# Patient Record
Sex: Male | Born: 1970 | Race: White | Hispanic: No | Marital: Single | Smoking: Current every day smoker
Health system: Southern US, Community
[De-identification: ages and names within clinical notes are randomized; demographics above are authoritative.]

## PROBLEM LIST (undated history)

## (undated) DIAGNOSIS — A4902 Methicillin resistant Staphylococcus aureus infection, unspecified site: Secondary | ICD-10-CM

---

## 1999-08-18 ENCOUNTER — Encounter: Payer: Self-pay | Admitting: Emergency Medicine

## 1999-08-18 ENCOUNTER — Emergency Department (HOSPITAL_COMMUNITY): Admission: EM | Admit: 1999-08-18 | Discharge: 1999-08-18 | Payer: Self-pay | Admitting: Emergency Medicine

## 2003-10-10 ENCOUNTER — Emergency Department (HOSPITAL_COMMUNITY): Admission: EM | Admit: 2003-10-10 | Discharge: 2003-10-10 | Payer: Self-pay | Admitting: Emergency Medicine

## 2003-12-12 ENCOUNTER — Emergency Department (HOSPITAL_COMMUNITY): Admission: EM | Admit: 2003-12-12 | Discharge: 2003-12-12 | Payer: Self-pay | Admitting: Emergency Medicine

## 2003-12-14 ENCOUNTER — Emergency Department (HOSPITAL_COMMUNITY): Admission: EM | Admit: 2003-12-14 | Discharge: 2003-12-14 | Payer: Self-pay | Admitting: Family Medicine

## 2004-04-19 ENCOUNTER — Emergency Department (HOSPITAL_COMMUNITY): Admission: EM | Admit: 2004-04-19 | Discharge: 2004-04-19 | Payer: Self-pay | Admitting: Emergency Medicine

## 2004-04-21 ENCOUNTER — Emergency Department (HOSPITAL_COMMUNITY): Admission: EM | Admit: 2004-04-21 | Discharge: 2004-04-21 | Payer: Self-pay | Admitting: Emergency Medicine

## 2006-12-14 ENCOUNTER — Emergency Department (HOSPITAL_COMMUNITY): Admission: EM | Admit: 2006-12-14 | Discharge: 2006-12-14 | Payer: Self-pay | Admitting: Emergency Medicine

## 2008-03-09 ENCOUNTER — Emergency Department (HOSPITAL_COMMUNITY): Admission: EM | Admit: 2008-03-09 | Discharge: 2008-03-09 | Payer: Self-pay | Admitting: Emergency Medicine

## 2009-07-25 ENCOUNTER — Emergency Department (HOSPITAL_COMMUNITY): Admission: EM | Admit: 2009-07-25 | Discharge: 2009-07-25 | Payer: Self-pay | Admitting: Emergency Medicine

## 2009-08-14 IMAGING — CR DG WRIST COMPLETE 3+V*R*
4 series · 4 of 4 positions shown · non-contrast
Comparison: No priors

CLINICAL DATA: Fell 2 weeks ago - pain and hand and wrist

RIGHT WRIST - COMPLETE 3+ VIEW

[x wrist pa right]
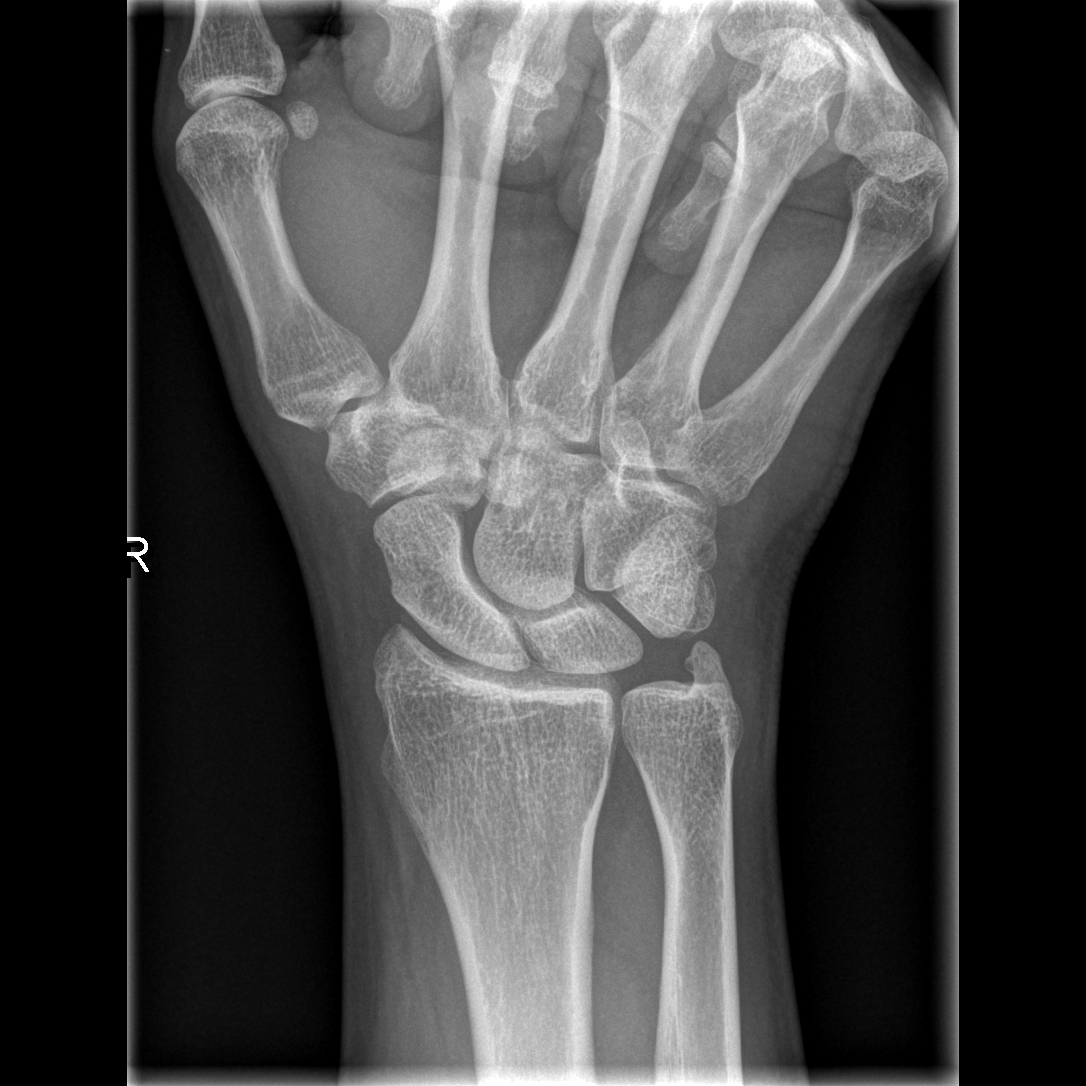

[x wrist obl right]
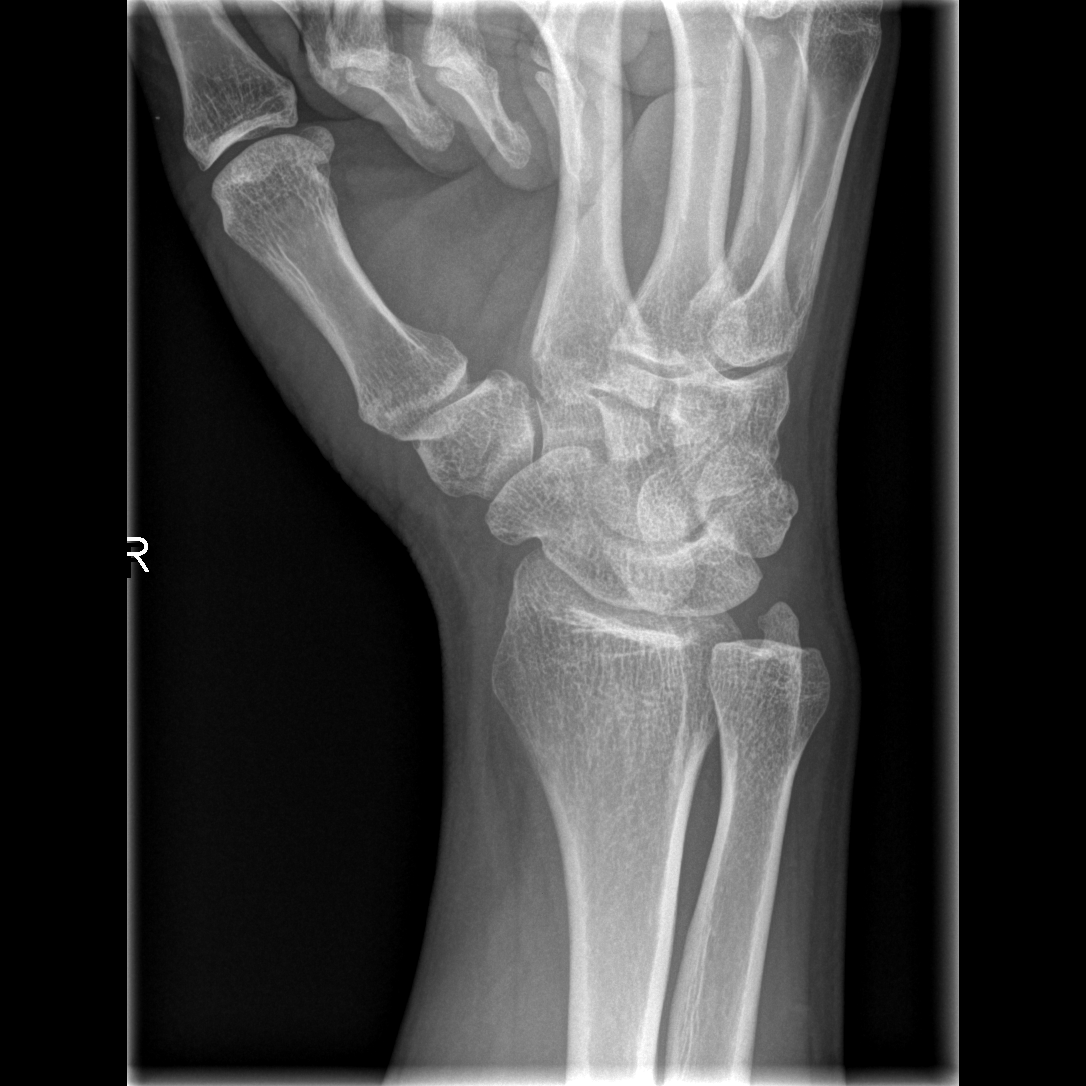

[x wrist lat right]
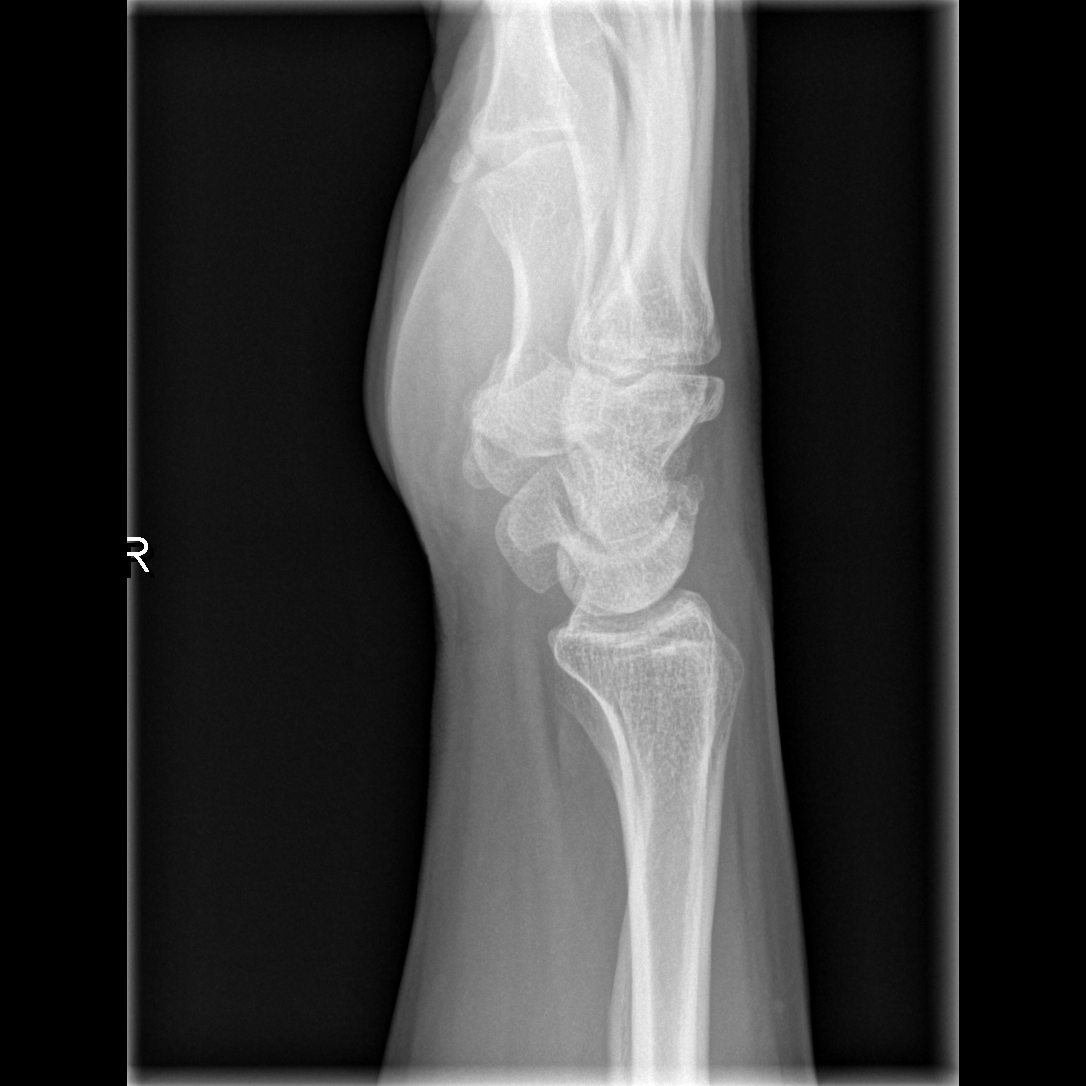

[x navicular]
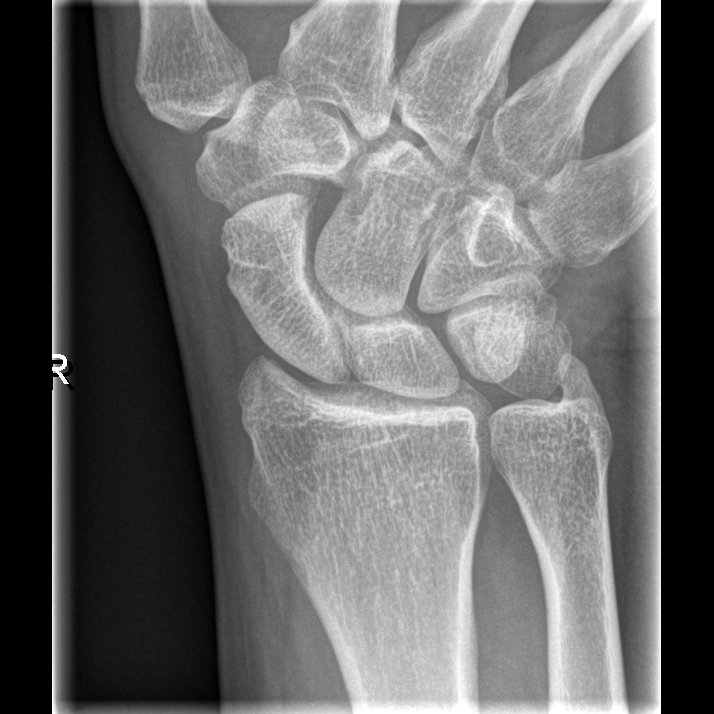

[4 of 4 positions shown; findings below may reference images not displayed]

FINDINGS: No definite fracture or dislocation.  There appears to be
a small accessory ossicle projecting over the dorsal aspect of the
wrist on the lateral view.  This could be due to remote trauma but
does not appear acute.
IMPRESSION: No acute findings.

## 2009-08-14 IMAGING — CR DG HAND COMPLETE 3+V*R*
3 series · 3 of 3 positions shown · non-contrast
Comparison: Report of a prior study 08/18/1999

CLINICAL DATA: Pain in right hand and wrist following fall 2 weeks
ago

RIGHT HAND - COMPLETE 3+ VIEW

[x hand pa right]
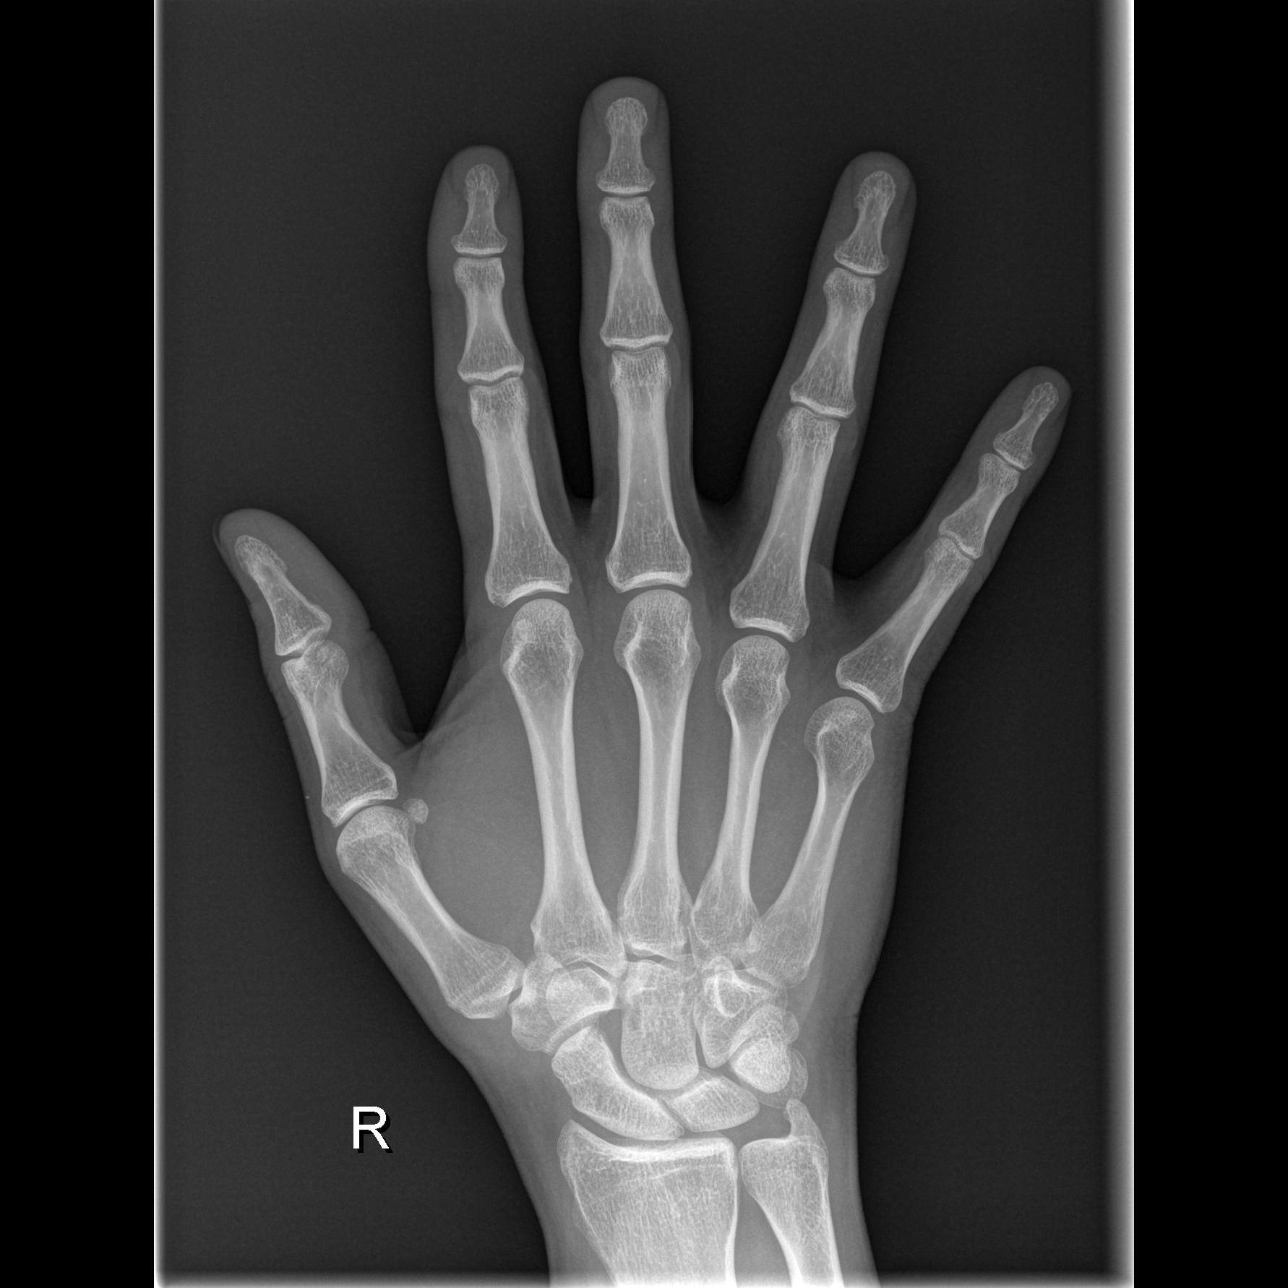

[x hand oblique right]
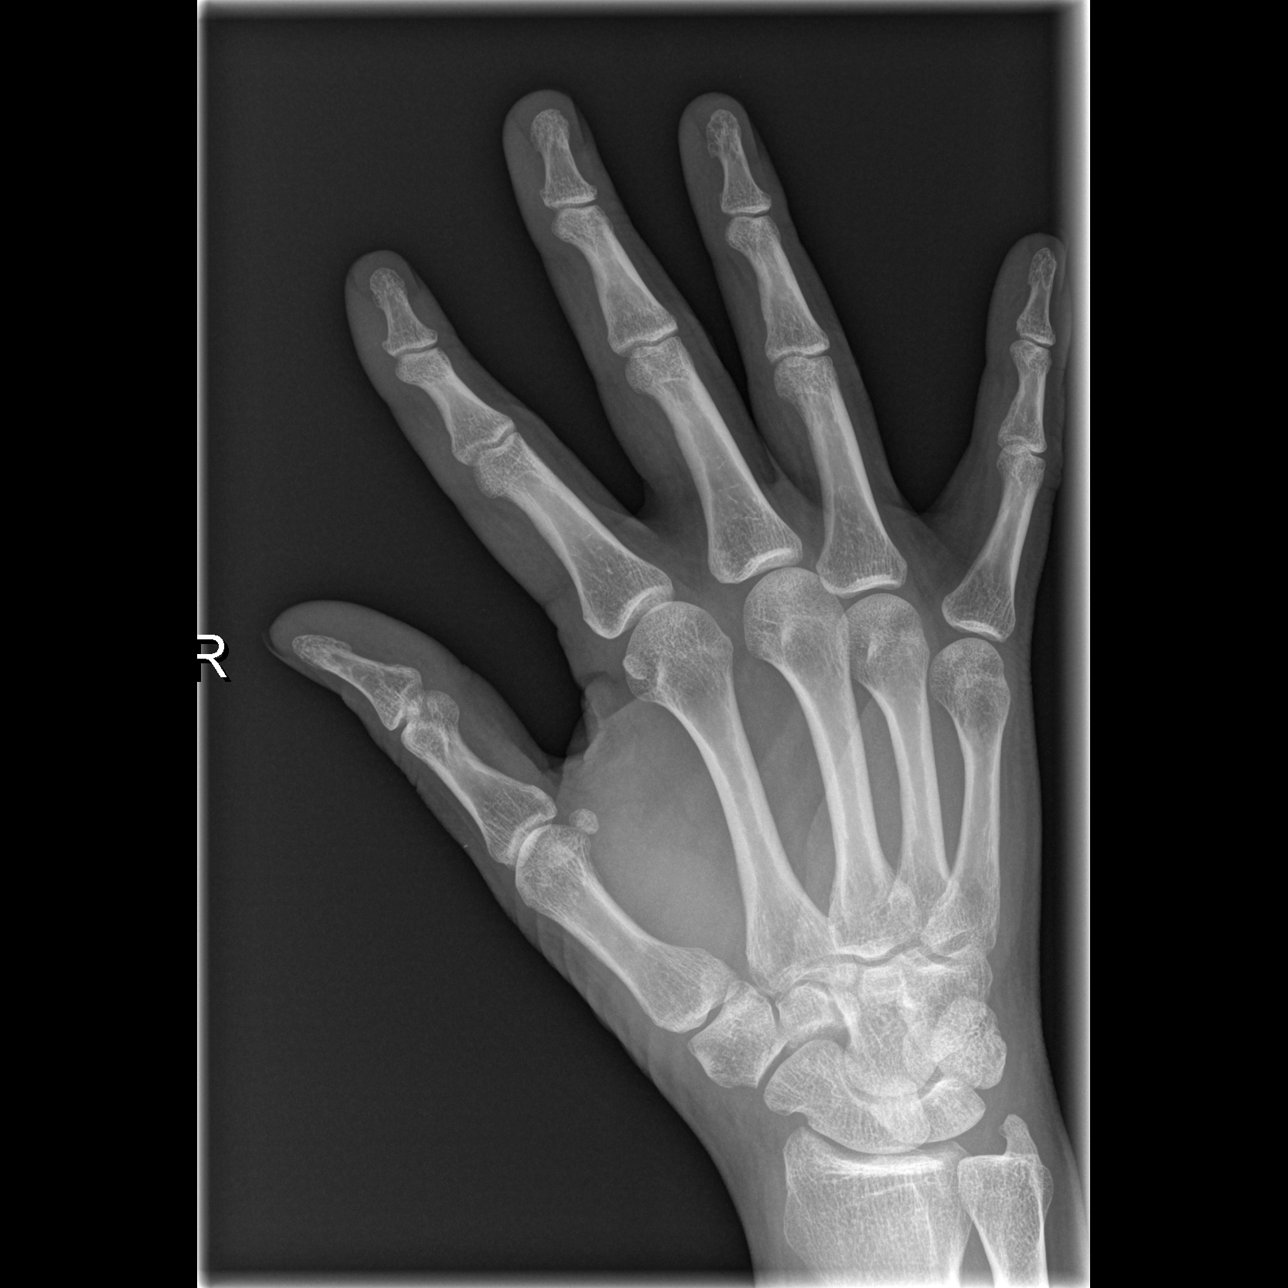

[x hand lat right]
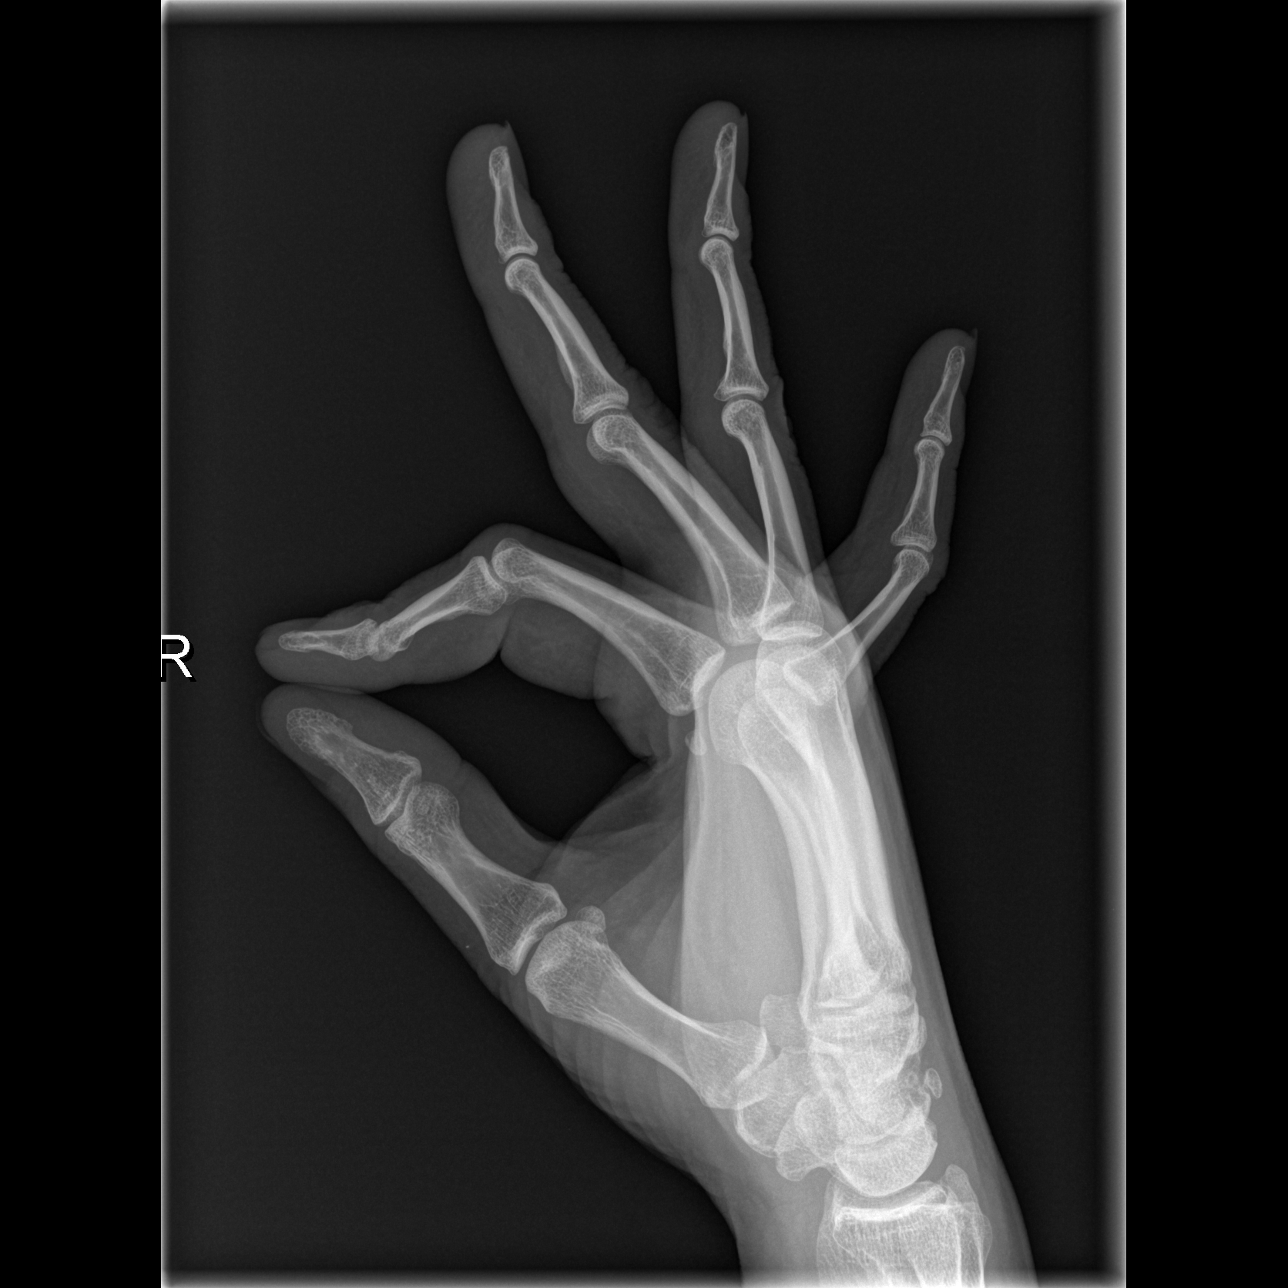

[3 of 3 positions shown; findings below may reference images not displayed]

FINDINGS: No definite fracture or dislocation.  Small accessory
ossicle noted over the dorsum of the carpal bones.
IMPRESSION: No acute findings.

## 2011-04-10 LAB — CULTURE, ROUTINE-ABSCESS

## 2011-08-24 ENCOUNTER — Emergency Department (HOSPITAL_COMMUNITY)
Admission: EM | Admit: 2011-08-24 | Discharge: 2011-08-24 | Disposition: A | Discharge status: Home / Self Care | Payer: Self-pay | Attending: Emergency Medicine | Admitting: Emergency Medicine

## 2011-08-24 ENCOUNTER — Encounter (HOSPITAL_COMMUNITY): Payer: Self-pay | Admitting: *Deleted

## 2011-08-24 DIAGNOSIS — L03019 Cellulitis of unspecified finger: Secondary | ICD-10-CM | POA: Insufficient documentation

## 2011-08-24 DIAGNOSIS — M79609 Pain in unspecified limb: Secondary | ICD-10-CM | POA: Insufficient documentation

## 2011-08-24 DIAGNOSIS — M7989 Other specified soft tissue disorders: Secondary | ICD-10-CM | POA: Insufficient documentation

## 2011-08-24 DIAGNOSIS — IMO0002 Reserved for concepts with insufficient information to code with codable children: Secondary | ICD-10-CM

## 2011-08-24 MED ORDER — ERYTHROMYCIN BASE 250 MG PO TABS: 250.0000 mg | ORAL_TABLET | Freq: Four times a day (QID) | ORAL | Status: AC

## 2011-08-24 MED ORDER — NAPROXEN 500 MG PO TABS: 500.0000 mg | ORAL_TABLET | Freq: Two times a day (BID) | ORAL | Status: AC

## 2011-08-24 NOTE — ED Notes (Signed)
Reports injury to right index finger cuticle yesterday, now has swelling.

## 2011-08-24 NOTE — ED Provider Notes (Signed)
History     CSN: [id number]  Arrival date & time 08/24/11  1136   First MD Initiated Contact with Patient 08/24/11 1148      Chief Complaint  Patient presents with  . Finger Injury    (Consider location/radiation/quality/duration/timing/severity/associated sxs/prior treatment) HPI Patient presents to the emergency room with complaints of swelling and redness of his right index finger cuticle. Patient states he for a piece of his cuticle off yesterday. Today he has noticed swelling redness and what appears to be pus underneath the skin. It is located him on the ulnar aspect of the cuticle of his right index finger. The pain is a 5/10. It is sharp. It increases with palpation and movement. He denies any fevers or any other complaints History reviewed. No pertinent past medical history.  History reviewed. No pertinent past surgical history.  History reviewed. No pertinent family history.  History  Substance Use Topics  . Smoking status: Current Everyday Smoker    Types: Cigarettes  . Smokeless tobacco: Not on file  . Alcohol Use: Yes     occ      Review of Systems  All other systems reviewed and are negative.    Allergies  Penicillins  Home Medications  No current outpatient prescriptions on file.  BP 125/69  Pulse 92  Temp(Src) 97.5 F (36.4 C) (Oral)  Resp 18  SpO2 97%  Physical Exam  Nursing note and vitals reviewed. Constitutional: He appears well-developed and well-nourished. No distress.  HENT:  Head: Normocephalic and atraumatic.  Right Ear: External ear normal.  Left Ear: External ear normal.  Eyes: Conjunctivae are normal. Right eye exhibits no discharge. Left eye exhibits no discharge. No scleral icterus.  Neck: Neck supple. No tracheal deviation present.  Cardiovascular: Normal rate.   Pulmonary/Chest: Effort normal. No stridor. No respiratory distress.  Musculoskeletal: He exhibits no edema.       Right hand: He exhibits tenderness and swelling.  He exhibits normal range of motion. normal sensation noted. Normal strength noted.       Hands: Neurological: He is alert. Cranial nerve deficit: no gross deficits.  Skin: Skin is warm and dry. No rash noted.  Psychiatric: He has a normal mood and affect.    ED Course  INCISION AND DRAINAGE Performed by: Linwood Dibbles R Authorized by: Linwood Dibbles R Consent: Verbal consent obtained. Written consent not obtained. Risks and benefits: risks, benefits and alternatives were discussed Consent given by: patient Type: abscess Body area: upper extremity Location details: right index finger Patient sedated: no Scalpel size: 11 Incision type: single straight Complexity: simple Drainage: purulent Drainage amount: moderate Patient tolerance: Patient tolerated the procedure well with no immediate complications.    Labs Reviewed - No data to display No results found.    MDM  Patient has a simple paronychia of his index finger. The wound was adequately incised and drained. He soaked his finger in warm water following the procedure. Patient has been instructed to continue warm soaks and he's been given medications for pain. I explained to him that typically antibiotics are not necessary. I will provide him a prescription for antibiotics but told him he should only begin taking them if he has does not noticed any improvement in the next couple of days.        Celene Kras, MD 08/24/11 [phone]

## 2013-09-30 ENCOUNTER — Emergency Department (HOSPITAL_COMMUNITY)
Admission: EM | Admit: 2013-09-30 | Discharge: 2013-09-30 | Disposition: A | Discharge status: Home / Self Care | Payer: Self-pay | Attending: Emergency Medicine | Admitting: Emergency Medicine

## 2013-09-30 ENCOUNTER — Encounter (HOSPITAL_COMMUNITY): Payer: Self-pay | Admitting: Emergency Medicine

## 2013-09-30 DIAGNOSIS — L739 Follicular disorder, unspecified: Secondary | ICD-10-CM

## 2013-09-30 DIAGNOSIS — F17200 Nicotine dependence, unspecified, uncomplicated: Secondary | ICD-10-CM | POA: Insufficient documentation

## 2013-09-30 DIAGNOSIS — Z880 Allergy status to penicillin: Secondary | ICD-10-CM | POA: Insufficient documentation

## 2013-09-30 DIAGNOSIS — R112 Nausea with vomiting, unspecified: Secondary | ICD-10-CM | POA: Insufficient documentation

## 2013-09-30 DIAGNOSIS — Z8614 Personal history of Methicillin resistant Staphylococcus aureus infection: Secondary | ICD-10-CM | POA: Insufficient documentation

## 2013-09-30 DIAGNOSIS — L738 Other specified follicular disorders: Secondary | ICD-10-CM | POA: Insufficient documentation

## 2013-09-30 DIAGNOSIS — L678 Other hair color and hair shaft abnormalities: Secondary | ICD-10-CM | POA: Insufficient documentation

## 2013-09-30 HISTORY — DX: Methicillin resistant Staphylococcus aureus infection, unspecified site: A49.02

## 2013-09-30 LAB — COMPREHENSIVE METABOLIC PANEL
ALBUMIN: 3.5 g/dL (ref 3.5–5.2)
ALT: 30 U/L (ref 0–53)
AST: 35 U/L (ref 0–37)
Alkaline Phosphatase: 97 U/L (ref 39–117)
BILIRUBIN TOTAL: 0.4 mg/dL (ref 0.3–1.2)
BUN: 14 mg/dL (ref 6–23)
CALCIUM: 9.4 mg/dL (ref 8.4–10.5)
CHLORIDE: 104 meq/L (ref 96–112)
CO2: 22 meq/L (ref 19–32)
CREATININE: 1.32 mg/dL (ref 0.50–1.35)
GFR calc Af Amer: 74 mL/min — ABNORMAL LOW (ref >90)
GFR calc non Af Amer: 64 mL/min — ABNORMAL LOW (ref >90)
Glucose, Bld: 108 mg/dL — ABNORMAL HIGH (ref 70–99)
Potassium: 4.8 mEq/L (ref 3.7–5.3)
SODIUM: 141 meq/L (ref 137–147)
Total Protein: 7 g/dL (ref 6.0–8.3)

## 2013-09-30 LAB — CBC WITH DIFFERENTIAL/PLATELET
BASOS ABS: 0 10*3/uL (ref 0.0–0.1)
BASOS PCT: 0 % (ref 0–1)
Eosinophils Absolute: 0.1 10*3/uL (ref 0.0–0.7)
Eosinophils Relative: 1 % (ref 0–5)
HEMATOCRIT: 40.9 % (ref 39.0–52.0)
Hemoglobin: 14.3 g/dL (ref 13.0–17.0)
LYMPHS PCT: 5 % — AB (ref 12–46)
Lymphs Abs: 0.7 10*3/uL (ref 0.7–4.0)
MCH: 31.2 pg (ref 26.0–34.0)
MCHC: 35 g/dL (ref 30.0–36.0)
MCV: 89.3 fL (ref 78.0–100.0)
MONO ABS: 1.6 10*3/uL — AB (ref 0.1–1.0)
Monocytes Relative: 11 % (ref 3–12)
NEUTROS ABS: 11.7 10*3/uL — AB (ref 1.7–7.7)
Neutrophils Relative %: 83 % — ABNORMAL HIGH (ref 43–77)
PLATELETS: 335 10*3/uL (ref 150–400)
RBC: 4.58 MIL/uL (ref 4.22–5.81)
RDW: 13.8 % (ref 11.5–15.5)
WBC: 14.1 10*3/uL — AB (ref 4.0–10.5)

## 2013-09-30 MED ORDER — FENTANYL CITRATE 0.05 MG/ML IJ SOLN
50.0000 ug | Freq: Once | INTRAMUSCULAR | Status: AC
  Administered 2013-09-30: 50 ug via INTRAVENOUS
  Filled 2013-09-30: qty 2

## 2013-09-30 MED ORDER — MUPIROCIN CALCIUM 2 % EX CREA: 1.0000 "application " | TOPICAL_CREAM | Freq: Three times a day (TID) | CUTANEOUS | Status: DC

## 2013-09-30 MED ORDER — ONDANSETRON HCL 4 MG/2ML IJ SOLN
4.0000 mg | Freq: Once | INTRAMUSCULAR | Status: AC
  Administered 2013-09-30: 4 mg via INTRAVENOUS
  Filled 2013-09-30: qty 2

## 2013-09-30 MED ORDER — ONDANSETRON 4 MG PO TBDP: 4.0000 mg | ORAL_TABLET | Freq: Three times a day (TID) | ORAL | Status: DC | PRN

## 2013-09-30 MED ORDER — SODIUM CHLORIDE 0.9 % IV BOLUS (SEPSIS)
1000.0000 mL | Freq: Once | INTRAVENOUS | Status: AC
  Administered 2013-09-30: 1000 mL via INTRAVENOUS

## 2013-09-30 MED ORDER — MORPHINE SULFATE 4 MG/ML IJ SOLN
4.0000 mg | Freq: Once | INTRAMUSCULAR | Status: DC
  Filled 2013-09-30: qty 1

## 2013-09-30 NOTE — ED Notes (Signed)
Pt with hx of MRSA to ED c/o pain and swelling to R upper lip near gum line.  In triage pt began to vomit and became hypotensive, though ao x 4 and non-diaphoretic.

## 2013-09-30 NOTE — ED Provider Notes (Signed)
CSN: [id number]     Arrival date & time 09/30/13  [id number] History   First MD Initiated Contact with Patient 09/30/13 [phone]     Chief Complaint  Patient presents with  . Abscess  . Hypotension     (Consider location/radiation/quality/duration/timing/severity/associated sxs/prior Treatment) HPI Comments: Patient is a 43 year old male past medical history significant for MRSA infection, tobacco use presented to the emergency department for 2 days of right-sided facial pain with associated swelling and erythema. Alleviating factors. Endorses area is tender to palpation. No other aggravating factors. Patient developed some nausea and 1-2 episodes of nonbloody nonbilious emesis while in the emergency department waiting room. States he still has some mild nausea but denies any abdominal pain. Patient denies any fevers, chills, purulent drainage from site, difficulty breathing or shortness of breath.   Patient is a 43 y.o. male presenting with abscess.  Abscess Associated symptoms: nausea and vomiting   Associated symptoms: no fever     Past Medical History  Diagnosis Date  . MRSA (methicillin resistant Staphylococcus aureus)    History reviewed. No pertinent past surgical history. No family history on file. History  Substance Use Topics  . Smoking status: Current Every Day Smoker -- 0.50 packs/day    Types: Cigarettes  . Smokeless tobacco: Not on file  . Alcohol Use: Yes     Comment: occ    Review of Systems  Constitutional: Negative for fever and chills.  HENT: Positive for facial swelling.   Respiratory: Negative for shortness of breath.   Cardiovascular: Negative for chest pain.  Gastrointestinal: Positive for nausea and vomiting. Negative for abdominal pain and diarrhea.  All other systems reviewed and are negative.     Allergies  Penicillins  Home Medications   Current Outpatient Rx  Name  Route  Sig  Dispense  Refill  . acetaminophen (TYLENOL) 325 MG tablet   Oral  Take 650 mg by mouth every 6 (six) hours as needed.         . diphenhydrAMINE (BENADRYL) 25 mg capsule   Oral   Take 50 mg by mouth every 6 (six) hours as needed for allergies.         . mupirocin cream (BACTROBAN) 2 %   Topical   Apply 1 application topically 3 (three) times daily. To affected area.   15 g   0   . ondansetron (ZOFRAN ODT) 4 MG disintegrating tablet   Oral   Take 1 tablet (4 mg total) by mouth every 8 (eight) hours as needed for nausea or vomiting.   10 tablet   0    BP 111/67  Pulse 93  Temp(Src) 99.1 F (37.3 C) (Oral)  Resp 16  SpO2 98% Physical Exam  Nursing note and vitals reviewed. Constitutional: He is oriented to person, place, and time. He appears well-developed and well-nourished. No distress.  HENT:  Head: Normocephalic and atraumatic.  Right Ear: External ear normal.  Left Ear: External ear normal.  Nose: Nose normal. No sinus tenderness, nasal deformity, septal deviation or nasal septal hematoma. No epistaxis.  No foreign bodies.    Mouth/Throat: Uvula is midline, oropharynx is clear and moist and mucous membranes are normal. No trismus in the jaw. No dental abscesses or uvula swelling. No oropharyngeal exudate or tonsillar abscesses.  Area marked is mildly erythematous with 1-2 small pustules noted, TTP, no induration or fluctuance.    Eyes: Conjunctivae are normal.  Neck: Normal range of motion. Neck supple.  Cardiovascular: Normal rate, regular rhythm,  normal heart sounds and intact distal pulses.   Pulmonary/Chest: Effort normal and breath sounds normal. No respiratory distress.  Abdominal: Soft. Bowel sounds are normal. He exhibits no distension. There is no tenderness. There is no rebound and no guarding.  Musculoskeletal: Normal range of motion.  Lymphadenopathy:    He has no cervical adenopathy.  Neurological: He is alert and oriented to person, place, and time.  Skin: Skin is warm and dry. He is not diaphoretic.  Psychiatric:  He has a normal mood and affect.    ED Course  Procedures (including critical care time) Medications  sodium chloride 0.9 % bolus 1,000 mL (0 mLs Intravenous Stopped 09/30/13 1121)  fentaNYL (SUBLIMAZE) injection 50 mcg (50 mcg Intravenous Given 09/30/13 1113)  ondansetron (ZOFRAN) injection 4 mg (4 mg Intravenous Given 09/30/13 1119)    Labs Review Labs Reviewed  CBC WITH DIFFERENTIAL - Abnormal; Notable for the following:    WBC 14.1 (*)    Neutrophils Relative % 83 (*)    Neutro Abs 11.7 (*)    Lymphocytes Relative 5 (*)    Monocytes Absolute 1.6 (*)    All other components within normal limits  COMPREHENSIVE METABOLIC PANEL - Abnormal; Notable for the following:    Glucose, Bld 108 (*)    GFR calc non Af Amer 64 (*)    GFR calc Af Amer 74 (*)    All other components within normal limits   Imaging Review No results found.   EKG Interpretation   Date/Time:  Thursday September 30 2013 08:32:38 EDT Ventricular Rate:  93 PR Interval:  158 QRS Duration: 76 QT Interval:  334 QTC Calculation: 415 R Axis:   96 Text Interpretation:  Normal sinus rhythm Rightward axis No previous  tracing Confirmed by Anitra Lauth  MD, WHITNEY ([id number]) on 09/30/2013 9:23:30 AM      MDM   Final diagnoses:  Nausea & vomiting  Folliculitis    Filed Vitals:   09/30/13 1247  BP: 111/67  Pulse: 93  Temp: 99.1 F (37.3 C)  Resp: 16   Afebrile, NAD, non-toxic appearing, AAOx4.   Patient initially noted to be hypotensive in triage, without chest pain, shortness of breath, diaphoresis, lightheadedness or dizziness. He only endorsed some nausea. On multiple repeat blood pressures patient did not drop below 100/50. He was given IV fluids with associated worsening of symptoms. He was not orthostatic symptomatically. Abdomen soft, non-tender, non-distended. A few episodes of non-bloody non-bilious emesis while in ED waiting room, no episodes while roomed in acute bed. Patient does not meet any Sepsis/SIRS  criteria. Mild leukocytosis noted, likely mildly elevated from acute emesis, labs otherwise normal. Patient noted to have folliculitis below right nostril. No evidence of gross abscess. Will prescribe bactroban. Patient able to ambulate without difficulty. He was discharged home with return precautions. Patient is agreeable to plan. Patient is stable at time of discharge     Jeannetta Ellis, PA-C 09/30/13 1432

## 2013-09-30 NOTE — Discharge Instructions (Signed)
Please follow up with your primary care physician in 1-2 days. If you do not have one please call the [facility] number listed above. Please take Zofran as prescribed. Please use Bactroban as prescribed. Please read all discharge instructions and return precautions.   Nausea and Vomiting Nausea is a sick feeling that often comes before throwing up (vomiting). Vomiting is a reflex where stomach contents come out of your mouth. Vomiting can cause severe loss of body fluids (dehydration). Children and elderly adults can become dehydrated quickly, especially if they also have diarrhea. Nausea and vomiting are symptoms of a condition or disease. It is important to find the cause of your symptoms. CAUSES   Direct irritation of the stomach lining. This irritation can result from increased acid production (gastroesophageal reflux disease), infection, food poisoning, taking certain medicines (such as nonsteroidal anti-inflammatory drugs), alcohol use, or tobacco use.  Signals from the brain.These signals could be caused by a headache, heat exposure, an inner ear disturbance, increased pressure in the brain from injury, infection, a tumor, or a concussion, pain, emotional stimulus, or metabolic problems.  An obstruction in the gastrointestinal tract (bowel obstruction).  Illnesses such as diabetes, hepatitis, gallbladder problems, appendicitis, kidney problems, cancer, sepsis, atypical symptoms of a heart attack, or eating disorders.  Medical treatments such as chemotherapy and radiation.  Receiving medicine that makes you sleep (general anesthetic) during surgery. DIAGNOSIS Your caregiver may ask for tests to be done if the problems do not improve after a few days. Tests may also be done if symptoms are severe or if the reason for the nausea and vomiting is not clear. Tests may include:  Urine tests.  Blood tests.  Stool tests.  Cultures (to look for evidence of  infection).  X-rays or other imaging studies. Test results can help your caregiver make decisions about treatment or the need for additional tests. TREATMENT You need to stay well hydrated. Drink frequently but in small amounts.You may wish to drink water, sports drinks, clear broth, or eat frozen ice pops or gelatin dessert to help stay hydrated.When you eat, eating slowly may help prevent nausea.There are also some antinausea medicines that may help prevent nausea. HOME CARE INSTRUCTIONS   Take all medicine as directed by your caregiver.  If you do not have an appetite, do not force yourself to eat. However, you must continue to drink fluids.  If you have an appetite, eat a normal diet unless your caregiver tells you differently.  Eat a variety of complex carbohydrates (rice, wheat, potatoes, bread), lean meats, yogurt, fruits, and vegetables.  Avoid high-fat foods because they are more difficult to digest.  Drink enough water and fluids to keep your urine clear or pale yellow.  If you are dehydrated, ask your caregiver for specific rehydration instructions. Signs of dehydration may include:  Severe thirst.  Dry lips and mouth.  Dizziness.  Dark urine.  Decreasing urine frequency and amount.  Confusion.  Rapid breathing or pulse. SEEK IMMEDIATE MEDICAL CARE IF:   You have blood or brown flecks (like coffee grounds) in your vomit.  You have black or bloody stools.  You have a severe headache or stiff neck.  You are confused.  You have severe abdominal pain.  You have chest pain or trouble breathing.  You do not urinate at least once every 8 hours.  You develop cold or clammy skin.  You continue to vomit for longer than 24 to 48 hours.  You have a fever.  MAKE SURE YOU:   Understand these instructions.  Will watch your condition.  Will get help right away if you are not doing well or get worse. Document Released: 06/10/2005 Document Revised: 09/02/2011  Document Reviewed: 11/07/2010 [facility] Patient Information 2014 [place], [place].  Folliculitis  Folliculitis is redness, soreness, and swelling (inflammation) of the hair follicles. This condition can occur anywhere on the body. People with weakened immune systems, diabetes, or obesity have a greater risk of getting folliculitis. CAUSES  Bacterial infection. This is the most common cause.  Fungal infection.  Viral infection.  Contact with certain chemicals, especially oils and tars. Long-term folliculitis can result from bacteria that live in the nostrils. The bacteria may trigger multiple outbreaks of folliculitis over time. SYMPTOMS Folliculitis most commonly occurs on the scalp, thighs, legs, back, buttocks, and areas where hair is shaved frequently. An early sign of folliculitis is a small, white or yellow, pus-filled, itchy lesion (pustule). These lesions appear on a red, inflamed follicle. They are usually less than 0.2 inches (5 mm) wide. When there is an infection of the follicle that goes deeper, it becomes a boil or furuncle. A group of closely packed boils creates a larger lesion (carbuncle). Carbuncles tend to occur in hairy, sweaty areas of the body. DIAGNOSIS  Your caregiver can usually tell what is wrong by doing a physical exam. A sample may be taken from one of the lesions and tested in a lab. This can help determine what is causing your folliculitis. TREATMENT  Treatment may include:  Applying warm compresses to the affected areas.  Taking antibiotic medicines orally or applying them to the skin.  Draining the lesions if they contain a large amount of pus or fluid.  Laser hair removal for cases of long-lasting folliculitis. This helps to prevent regrowth of the hair. HOME CARE INSTRUCTIONS  Apply warm compresses to the affected areas as directed by your caregiver.  If antibiotics are prescribed, take them as directed. Finish them even if you start to feel  better.  You may take over-the-counter medicines to relieve itching.  Do not shave irritated skin.  Follow up with your caregiver as directed. SEEK IMMEDIATE MEDICAL CARE IF:   You have increasing redness, swelling, or pain in the affected area.  You have a fever. MAKE SURE YOU:  Understand these instructions.  Will watch your condition.  Will get help right away if you are not doing well or get worse. Document Released: 08/19/2001 Document Revised: 12/10/2011 Document Reviewed: 09/10/2011 [facility] Patient Information 2014 [place], [place].

## 2013-09-30 NOTE — ED Notes (Addendum)
Per provider hold morphine until BP comes up.

## 2013-09-30 NOTE — ED Provider Notes (Signed)
Medical screening examination/treatment/procedure(s) were performed by non-physician practitioner and as supervising physician I was immediately available for consultation/collaboration.   EKG Interpretation   Date/Time:  Thursday September 30 2013 08:32:38 EDT Ventricular Rate:  93 PR Interval:  158 QRS Duration: 76 QT Interval:  334 QTC Calculation: 415 R Axis:   96 Text Interpretation:  Normal sinus rhythm Rightward axis No previous  tracing Confirmed by Anitra LauthPLUNKETT  MD, Rakin Lemelle ([id number]) on 09/30/2013 9:23:30 AM        Gwyneth SproutWhitney Dontel Harshberger, MD 09/30/13 [phone]

## 2015-04-21 ENCOUNTER — Emergency Department (HOSPITAL_COMMUNITY)
Admission: EM | Admit: 2015-04-21 | Discharge: 2015-04-21 | Disposition: A | Discharge status: Home / Self Care | Payer: Self-pay | Attending: Emergency Medicine | Admitting: Emergency Medicine

## 2015-04-21 ENCOUNTER — Emergency Department (HOSPITAL_COMMUNITY): Payer: Self-pay

## 2015-04-21 ENCOUNTER — Encounter (HOSPITAL_COMMUNITY): Payer: Self-pay | Admitting: Emergency Medicine

## 2015-04-21 DIAGNOSIS — Z720 Tobacco use: Secondary | ICD-10-CM | POA: Insufficient documentation

## 2015-04-21 DIAGNOSIS — X58XXXA Exposure to other specified factors, initial encounter: Secondary | ICD-10-CM | POA: Insufficient documentation

## 2015-04-21 DIAGNOSIS — Y92310 Basketball court as the place of occurrence of the external cause: Secondary | ICD-10-CM | POA: Insufficient documentation

## 2015-04-21 DIAGNOSIS — Y998 Other external cause status: Secondary | ICD-10-CM | POA: Insufficient documentation

## 2015-04-21 DIAGNOSIS — Z880 Allergy status to penicillin: Secondary | ICD-10-CM | POA: Insufficient documentation

## 2015-04-21 DIAGNOSIS — Y9367 Activity, basketball: Secondary | ICD-10-CM | POA: Insufficient documentation

## 2015-04-21 DIAGNOSIS — S93401A Sprain of unspecified ligament of right ankle, initial encounter: Secondary | ICD-10-CM | POA: Insufficient documentation

## 2015-04-21 DIAGNOSIS — Z8614 Personal history of Methicillin resistant Staphylococcus aureus infection: Secondary | ICD-10-CM | POA: Insufficient documentation

## 2015-04-21 DIAGNOSIS — Z792 Long term (current) use of antibiotics: Secondary | ICD-10-CM | POA: Insufficient documentation

## 2015-04-21 MED ORDER — IBUPROFEN 400 MG PO TABS
800.0000 mg | ORAL_TABLET | Freq: Once | ORAL | Status: AC
  Administered 2015-04-21: 800 mg via ORAL
  Filled 2015-04-21: qty 2

## 2015-04-21 MED ORDER — IBUPROFEN 800 MG PO TABS: 800.0000 mg | ORAL_TABLET | Freq: Three times a day (TID) | ORAL | Status: DC

## 2015-04-21 NOTE — ED Provider Notes (Signed)
CSN: [id number]     Arrival date & time 04/21/15  0920 History  By signing my name below, I, Elon Spanner, attest that this documentation has been prepared under the direction and in the presence of [facility], PA-C. Electronically Signed: Elon Spanner ED Scribe. 04/21/2015. 9:23 AM.    No chief complaint on file.  The history is provided by the patient. No language interpreter was used.   HPI Comments: Joseph Garza is a 44 y.o. male who presents to the Emergency Department complaining of constant, aching/throbbing right ankle pain and swelling onset last night after rolling his ankle while playing basketball; worse with walking; no treatments tried.  He denies other injuries, complaints.    Past Medical History  Diagnosis Date  . MRSA (methicillin resistant Staphylococcus aureus)    No past surgical history on file. No family history on file. Social History  Substance Use Topics  . Smoking status: Current Every Day Smoker -- 0.50 packs/day    Types: Cigarettes  . Smokeless tobacco: Not on file  . Alcohol Use: Yes     Comment: occ    Review of Systems  Musculoskeletal: Positive for joint swelling and arthralgias.  All other systems reviewed and are negative.     Allergies  Penicillins  Home Medications   Prior to Admission medications   Medication Sig Start Date End Date Taking? Authorizing Provider  acetaminophen (TYLENOL) 325 MG tablet Take 650 mg by mouth every 6 (six) hours as needed.    Historical Provider, MD  diphenhydrAMINE (BENADRYL) 25 mg capsule Take 50 mg by mouth every 6 (six) hours as needed for allergies.    Historical Provider, MD  mupirocin cream (BACTROBAN) 2 % Apply 1 application topically 3 (three) times daily. To affected area. 09/30/13   Jennifer Piepenbrink, PA-C  ondansetron (ZOFRAN ODT) 4 MG disintegrating tablet Take 1 tablet (4 mg total) by mouth every 8 (eight) hours as needed for nausea or vomiting. 09/30/13   Francee Piccolo, PA-C    There were no vitals taken for this visit. Physical Exam  Constitutional: He is oriented to person, place, and time. He appears well-developed and well-nourished. No distress.  HENT:  Head: Normocephalic and atraumatic.  Eyes: Conjunctivae and EOM are normal.  Neck: Normal range of motion. Neck supple. No tracheal deviation present.  Cardiovascular: Normal rate.   Pulmonary/Chest: Effort normal. No respiratory distress.  Abdominal: Soft. He exhibits no distension. There is no tenderness. There is no rebound.  Musculoskeletal:  Right ankle limited ROM due to pain. No obvious deformity. Generalized tenderness to palpation. Generalized swelling of right ankle.   Neurological: He is alert and oriented to person, place, and time. Coordination normal.  Skin: Skin is warm and dry.  Psychiatric: He has a normal mood and affect. His behavior is normal.  Nursing note and vitals reviewed.   ED Course  Procedures (including critical care time)   SPLINT APPLICATION Date/Time: 10:20 AM Authorized by: Emilia Beck Consent: Verbal consent obtained. Risks and benefits: risks, benefits and alternatives were discussed Consent given by: patient Splint applied by: orthopedic technician Location details: right ankle Splint type: aso brace Supplies used: aso brace Post-procedure: The splinted body part was neurovascularly unchanged following the procedure. Patient tolerance: Patient tolerated the procedure well with no immediate complications.     DIAGNOSTIC STUDIES: Oxygen Saturation is 97% on RA, normal by my interpretation.    COORDINATION OF CARE:  10:18 AM Will order ibuprofen and provide ankle brace.  Patient should  RICE and use ibuprofen/Tylenol.  Patient was offered but declined crutches.  Patient acknowledges and agrees with plan.    Labs Review Labs Reviewed - No data to display  Imaging Review Dg Ankle Complete Right  04/21/2015  CLINICAL DATA:  Basketball injury last  night as rolled right foot/ankle. EXAM: RIGHT ANKLE - COMPLETE 3+ VIEW COMPARISON:  None. FINDINGS: There is no evidence of fracture, dislocation, or joint effusion. There is no evidence of arthropathy or other focal bone abnormality. Soft tissues are unremarkable. IMPRESSION: Negative. Electronically Signed   By: Elberta Fortisaniel  Boyle M.D.   On: 04/21/2015 09:59   Dg Foot Complete Right  04/21/2015  CLINICAL DATA:  Acute right foot pain and swelling after injury playing basketball last night. Initial encounter. EXAM: RIGHT FOOT COMPLETE - 3+ VIEW COMPARISON:  None. FINDINGS: There is no evidence of fracture or dislocation. There is no evidence of arthropathy or other focal bone abnormality. Soft tissues are unremarkable. IMPRESSION: Normal right foot. Electronically Signed   By: Lupita RaiderJames  Green Jr, M.D.   On: 04/21/2015 10:01   I have personally reviewed and evaluated these images and lab results as part of my medical decision-making.   EKG Interpretation None      MDM   Final diagnoses:  Right ankle sprain, initial encounter    10:21 AM Patient's xray shows no acute changes. Patient has a right ankle sprain and will have ASO brace and ibuprofen for pain. No other injury.   I personally performed the services described in this documentation, which was scribed in my presence. The recorded information has been reviewed and is accurate.    Emilia BeckKaitlyn Edith Lord, PA-C 04/21/15 1025  Gilda Creasehristopher J Pollina, MD 04/21/15 1530

## 2015-04-21 NOTE — ED Notes (Signed)
Twisted right foot last pm while playing basketball. Swelling noted to dorsal right foot.

## 2015-04-21 NOTE — Discharge Instructions (Signed)
Take ibuprofen as needed for pain. Refer to attached documents for more information. Rest, ice, and elevate your ankle for pain relief.

## 2015-11-13 ENCOUNTER — Emergency Department (HOSPITAL_COMMUNITY)
Admission: EM | Admit: 2015-11-13 | Discharge: 2015-11-13 | Disposition: A | Discharge status: Home / Self Care | Payer: Self-pay | Attending: Emergency Medicine | Admitting: Emergency Medicine

## 2015-11-13 ENCOUNTER — Encounter (HOSPITAL_COMMUNITY): Payer: Self-pay | Admitting: Emergency Medicine

## 2015-11-13 ENCOUNTER — Emergency Department (HOSPITAL_COMMUNITY): Payer: Self-pay

## 2015-11-13 DIAGNOSIS — H9311 Tinnitus, right ear: Secondary | ICD-10-CM | POA: Insufficient documentation

## 2015-11-13 DIAGNOSIS — R55 Syncope and collapse: Secondary | ICD-10-CM

## 2015-11-13 DIAGNOSIS — Z79899 Other long term (current) drug therapy: Secondary | ICD-10-CM | POA: Insufficient documentation

## 2015-11-13 DIAGNOSIS — Z791 Long term (current) use of non-steroidal anti-inflammatories (NSAID): Secondary | ICD-10-CM | POA: Insufficient documentation

## 2015-11-13 DIAGNOSIS — F17210 Nicotine dependence, cigarettes, uncomplicated: Secondary | ICD-10-CM | POA: Insufficient documentation

## 2015-11-13 LAB — COMPREHENSIVE METABOLIC PANEL
ALBUMIN: 3.5 g/dL (ref 3.5–5.0)
ALK PHOS: 64 U/L (ref 38–126)
ALT: 15 U/L — ABNORMAL LOW (ref 17–63)
ANION GAP: 7 (ref 5–15)
AST: 19 U/L (ref 15–41)
BUN: 15 mg/dL (ref 6–20)
CALCIUM: 9 mg/dL (ref 8.9–10.3)
CO2: 20 mmol/L — ABNORMAL LOW (ref 22–32)
CREATININE: 1.55 mg/dL — AB (ref 0.61–1.24)
Chloride: 113 mmol/L — ABNORMAL HIGH (ref 101–111)
GFR calc Af Amer: >60 mL/min (ref >60)
GFR calc non Af Amer: 52 mL/min — ABNORMAL LOW (ref >60)
Glucose, Bld: 86 mg/dL (ref 65–99)
Potassium: 4.5 mmol/L (ref 3.5–5.1)
SODIUM: 140 mmol/L (ref 135–145)
TOTAL PROTEIN: 6.3 g/dL — AB (ref 6.5–8.1)
Total Bilirubin: 0.3 mg/dL (ref 0.3–1.2)

## 2015-11-13 LAB — CBC WITH DIFFERENTIAL/PLATELET
BASOS ABS: 0.1 10*3/uL (ref 0.0–0.1)
BASOS PCT: 0 %
EOS ABS: 0.1 10*3/uL (ref 0.0–0.7)
EOS PCT: 1 %
HCT: 44.5 % (ref 39.0–52.0)
HEMOGLOBIN: 15.5 g/dL (ref 13.0–17.0)
Lymphocytes Relative: 16 %
Lymphs Abs: 2.6 10*3/uL (ref 0.7–4.0)
MCH: 30.3 pg (ref 26.0–34.0)
MCHC: 34.8 g/dL (ref 30.0–36.0)
MCV: 86.9 fL (ref 78.0–100.0)
Monocytes Absolute: 2 10*3/uL — ABNORMAL HIGH (ref 0.1–1.0)
Monocytes Relative: 12 %
NEUTROS PCT: 71 %
Neutro Abs: 11.7 10*3/uL — ABNORMAL HIGH (ref 1.7–7.7)
PLATELETS: 380 10*3/uL (ref 150–400)
RBC: 5.12 MIL/uL (ref 4.22–5.81)
RDW: 14.5 % (ref 11.5–15.5)
WBC: 16.4 10*3/uL — AB (ref 4.0–10.5)

## 2015-11-13 LAB — I-STAT CG4 LACTIC ACID, ED
LACTIC ACID, VENOUS: 3.55 mmol/L — AB (ref 0.5–2.0)
Lactic Acid, Venous: 0.93 mmol/L (ref 0.5–2.0)

## 2015-11-13 LAB — TROPONIN I

## 2015-11-13 LAB — CBG MONITORING, ED: GLUCOSE-CAPILLARY: 88 mg/dL (ref 65–99)

## 2015-11-13 MED ORDER — SODIUM CHLORIDE 0.9 % IV BOLUS (SEPSIS)
1000.0000 mL | Freq: Once | INTRAVENOUS | Status: AC
  Administered 2015-11-13: 1000 mL via INTRAVENOUS

## 2015-11-13 NOTE — ED Notes (Signed)
Pt woke with a "stuffy ear" on the right  Friday am and had pain.  He began to have ringing in his ear and has pain 2/10

## 2015-11-13 NOTE — Discharge Instructions (Signed)
Syncope, commonly known as fainting, is a temporary loss of consciousness. It occurs when the blood flow to the brain is reduced. Vasovagal syncope (also called neurocardiogenic syncope) is a fainting spell in which the blood flow to the brain is reduced because of a sudden drop in heart rate and blood pressure. Vasovagal syncope occurs when the brain and the cardiovascular system (blood vessels) do not adequately communicate and respond to each other. This is the most common cause of fainting. It often occurs in response to fear or some other type of emotional or physical stress. The body has a reaction in which the heart starts beating too slowly or the blood vessels expand, reducing blood pressure. This type of fainting spell is generally considered harmless. However, injuries can occur if a person takes a sudden fall during a fainting spell.   CAUSES   Vasovagal syncope occurs when a person's blood pressure and heart rate decrease suddenly, usually in response to a trigger. Many things and situations can trigger an episode. Some of these include:    Pain.    Fear.    The sight of blood or medical procedures, such as blood being drawn from a vein.    Common activities, such as coughing, swallowing, stretching, or going to the bathroom.    Emotional stress.    Prolonged standing, especially in a warm environment.    Lack of sleep or rest.    Prolonged lack of food.    Prolonged lack of fluids.    Recent illness.   The use of certain drugs that affect blood pressure, such as cocaine, alcohol, marijuana, inhalants, and opiates.   SYMPTOMS   Before the fainting episode, you may:    Feel dizzy or light headed.    Become pale.   Sense that you are going to faint.    Feel like the room is spinning.    Have tunnel vision, only seeing directly in front of you.    Feel sick to your stomach (nauseous).    See spots or slowly lose vision.    Hear ringing in your ears.    Have a headache.     Feel warm and sweaty.    Feel a sensation of pins and needles.  During the fainting spell, you will generally be unconscious for no longer than a couple minutes before waking up and returning to normal. If you get up too quickly before your body can recover, you may faint again. Some twitching or jerky movements may occur during the fainting spell.   DIAGNOSIS   Your health care provider will ask about your symptoms, take a medical history, and perform a physical exam. Various tests may be done to rule out other causes of fainting. These may include blood tests and tests to check the heart, such as electrocardiography, echocardiography, and possibly an electrophysiology study. When other causes have been ruled out, a test may be done to check the body's response to changes in position (tilt table test).  TREATMENT   Most cases of vasovagal syncope do not require treatment. Your health care provider may recommend ways to avoid fainting triggers and may provide home strategies for preventing fainting. If you must be exposed to a possible trigger, you can drink additional fluids to help reduce your chances of having an episode of vasovagal syncope. If you have warning signs of an oncoming episode, you can respond by positioning yourself favorably (lying down).  If your fainting spells continue, you may be   given medicines to prevent fainting. Some medicines may help make you more resistant to repeated episodes of vasovagal syncope. Special exercises or compression stockings may be recommended. In rare cases, the surgical placement of a pacemaker is considered.  HOME CARE INSTRUCTIONS    Learn to identify the warning signs of vasovagal syncope.    Sit or lie down at the first warning sign of a fainting spell. If sitting, put your head down between your legs. If you lie down, swing your legs up in the air to increase blood flow to the brain.    Avoid hot tubs and saunas.   Avoid prolonged standing.   Drink  enough fluids to keep your urine clear or pale yellow. Avoid caffeine.   Increase salt in your diet as directed by your health care provider.    If you have to stand for a long time, perform movements such as:     Crossing your legs.     Flexing and stretching your leg muscles.     Squatting.     Moving your legs.     Bending over.    Only take over-the-counter or prescription medicines as directed by your health care provider. Do not suddenly stop any medicines without asking your health care provider first.  SEEK MEDICAL CARE IF:    Your fainting spells continue or happen more frequently in spite of treatment.    You lose consciousness for more than a couple minutes.   You have fainting spells during or after exercising or after being startled.    You have new symptoms that occur with the fainting spells, such as:     Shortness of breath.    Chest pain.     Irregular heartbeat.    You have episodes of twitching or jerky movements that last longer than a few seconds.   You have episodes of twitching or jerky movements without obvious fainting.  SEEK IMMEDIATE MEDICAL CARE IF:    You have injuries or bleeding after a fainting spell.    You have episodes of twitching or jerky movements that last longer than 5 minutes.    You have more than one spell of twitching or jerky movements before returning to consciousness after fainting.     This information is not intended to replace advice given to you by your health care provider. Make sure you discuss any questions you have with your health care provider.     Document Released: 05/27/2012 Document Revised: 10/25/2014 Document Reviewed: 05/27/2012  [employer]vier Interactive Patient Education 2016 [employer]

## 2015-11-13 NOTE — ED Notes (Signed)
Bed: WA17 Expected date:  Expected time:  Means of arrival:  Comments: Hold for fast track pt

## 2015-11-13 NOTE — ED Notes (Signed)
Pt ambulatory and independent at discharge.  

## 2015-11-13 NOTE — ED Notes (Signed)
Critical Istat result given to EDP Pickering and RN 

## 2015-11-13 NOTE — ED Provider Notes (Signed)
CSN: [id number]     Arrival date & time 11/13/15  1554 History   First MD Initiated Contact with Patient 11/13/15 1621     Chief Complaint  Patient presents with  . Tinnitus     The history is provided by the patient.  Patient presented to the ER for ringing in his right ear. While in fast track you began to feel lightheaded became diaphoretic and had a syncopal episode. Quickly regained consciousness and was not confused. No headache. No chest pain. Some nausea with the episode and vomited. Had a second episode and was taken to the main ER. No chest pain. No bowel pain. He's been doing well otherwise recently. No previous passing out.   Past Medical History  Diagnosis Date  . MRSA (methicillin resistant Staphylococcus aureus)    History reviewed. No pertinent past surgical history. History reviewed. No pertinent family history. Social History  Substance Use Topics  . Smoking status: Current Every Day Smoker -- 0.50 packs/day    Types: Cigarettes  . Smokeless tobacco: None  . Alcohol Use: Yes     Comment: occ    Review of Systems  Constitutional: Negative for fever, activity change and appetite change.  HENT: Positive for tinnitus.   Eyes: Negative for pain.  Respiratory: Negative for chest tightness and shortness of breath.   Cardiovascular: Negative for chest pain and leg swelling.  Gastrointestinal: Positive for vomiting. Negative for nausea, abdominal pain and diarrhea.  Genitourinary: Negative for flank pain.  Musculoskeletal: Negative for back pain and neck stiffness.  Skin: Negative for rash.  Neurological: Positive for syncope. Negative for weakness, numbness and headaches.  Psychiatric/Behavioral: Negative for behavioral problems.      Allergies  Penicillins  Home Medications   Prior to Admission medications   Medication Sig Start Date End Date Taking? Authorizing Provider  diphenhydrAMINE (BENADRYL) 25 mg capsule Take 50 mg by mouth every 6 (six) hours as  needed for allergies.   Yes Historical Provider, MD  ibuprofen (ADVIL,MOTRIN) 200 MG tablet Take 400 mg by mouth every 6 (six) hours as needed for headache or moderate pain.   Yes Historical Provider, MD   BP 97/68 mmHg  Pulse 60  Temp(Src) 98.7 F (37.1 C) (Oral)  Resp 18  Ht 6' (1.829 m)  Wt 183 lb (83.008 kg)  BMI 24.81 kg/m2  SpO2 100% Physical Exam  Constitutional: He appears well-developed.  HENT:  Head: Atraumatic.  Mild erythema on right TM. Left TM obscured by cerumen.  Neck: Neck supple.  Cardiovascular: Normal rate.   Pulmonary/Chest: Effort normal.  Abdominal: Soft. There is no tenderness.  Musculoskeletal: Normal range of motion.  Neurological: He is alert.  Skin: He is diaphoretic. There is pallor.    ED Course  Procedures (including critical care time) Labs Review Labs Reviewed  COMPREHENSIVE METABOLIC PANEL - Abnormal; Notable for the following:    Chloride 113 (*)    CO2 20 (*)    Creatinine, Ser 1.55 (*)    Total Protein 6.3 (*)    ALT 15 (*)    GFR calc non Af Amer 52 (*)    All other components within normal limits  CBC WITH DIFFERENTIAL/PLATELET - Abnormal; Notable for the following:    WBC 16.4 (*)    Neutro Abs 11.7 (*)    Monocytes Absolute 2.0 (*)    All other components within normal limits  I-STAT CG4 LACTIC ACID, ED - Abnormal; Notable for the following:    Lactic Acid, Venous  3.55 (*)    All other components within normal limits  TROPONIN I  CBG MONITORING, ED  I-STAT CG4 LACTIC ACID, ED    Imaging Review Dg Chest Portable 1 View  11/13/2015  CLINICAL DATA:  Syncope. EXAM: PORTABLE CHEST 1 VIEW COMPARISON:  None. FINDINGS: Heart size is normal. Overall cardiomediastinal silhouette is normal in size and configuration. Mild interstitial prominence bilaterally. Lungs otherwise clear. No confluent opacity to suggest consolidating pneumonia. No pleural effusion or pneumothorax seen. Osseous structures about the chest are unremarkable.  IMPRESSION: Mild interstitial prominence. Differential considerations include chronic interstitial lung disease and mild interstitial edema related to underlying infectious or inflammatory process. Bronchiolitis? Lungs otherwise clear. No evidence of consolidating pneumonia. No pleural effusion. Electronically Signed   By: Bary Richard M.D.   On: 11/13/2015 17:03   I have personally reviewed and evaluated these images and lab results as part of my medical decision-making.   EKG Interpretation   Date/Time:  Monday Nov 13 2015 16:38:48 EDT Ventricular Rate:  65 PR Interval:  161 QRS Duration: 97 QT Interval:  382 QTC Calculation: 397 R Axis:   53 Text Interpretation:  Sinus rhythm Low voltage, extremity and precordial  leads ST elev, probable normal early repol pattern Confirmed by Rubin Payor   MD, Harrold Donath [phone]) on 11/13/2015 5:19:09 PM      MDM   Final diagnoses:  Vasovagal syncope    Patient with likely vasovagal syncope event. Happened while being evaluated for his ringing in his ear. Likely unrelated. Initial lactic acid elevated but came down after IV fluids. Felt much better. Ear does not appear infected although there is some mild erythema. Will discharge home to follow-up as needed.    Benjiman Core, MD 11/13/15 [phone]

## 2016-03-21 ENCOUNTER — Emergency Department (HOSPITAL_COMMUNITY): Payer: Self-pay

## 2016-03-21 ENCOUNTER — Emergency Department (HOSPITAL_COMMUNITY)
Admission: EM | Admit: 2016-03-21 | Discharge: 2016-03-21 | Disposition: A | Discharge status: Home / Self Care | Payer: Self-pay | Attending: Emergency Medicine | Admitting: Emergency Medicine

## 2016-03-21 ENCOUNTER — Encounter (HOSPITAL_COMMUNITY): Payer: Self-pay | Admitting: *Deleted

## 2016-03-21 DIAGNOSIS — E860 Dehydration: Secondary | ICD-10-CM | POA: Insufficient documentation

## 2016-03-21 DIAGNOSIS — F17210 Nicotine dependence, cigarettes, uncomplicated: Secondary | ICD-10-CM | POA: Insufficient documentation

## 2016-03-21 DIAGNOSIS — R42 Dizziness and giddiness: Secondary | ICD-10-CM

## 2016-03-21 LAB — COMPREHENSIVE METABOLIC PANEL
ALT: 17 U/L (ref 17–63)
ANION GAP: 7 (ref 5–15)
AST: 16 U/L (ref 15–41)
Albumin: 3.7 g/dL (ref 3.5–5.0)
Alkaline Phosphatase: 67 U/L (ref 38–126)
BILIRUBIN TOTAL: 0.9 mg/dL (ref 0.3–1.2)
BUN: 16 mg/dL (ref 6–20)
CALCIUM: 9.2 mg/dL (ref 8.9–10.3)
CO2: 24 mmol/L (ref 22–32)
Chloride: 108 mmol/L (ref 101–111)
Creatinine, Ser: 1.13 mg/dL (ref 0.61–1.24)
GFR calc Af Amer: >60 mL/min (ref >60)
Glucose, Bld: 116 mg/dL — ABNORMAL HIGH (ref 65–99)
POTASSIUM: 4 mmol/L (ref 3.5–5.1)
Sodium: 139 mmol/L (ref 135–145)
TOTAL PROTEIN: 6.3 g/dL — AB (ref 6.5–8.1)

## 2016-03-21 LAB — CBC WITH DIFFERENTIAL/PLATELET
Basophils Absolute: 0 10*3/uL (ref 0.0–0.1)
Basophils Relative: 0 %
Eosinophils Absolute: 0.1 10*3/uL (ref 0.0–0.7)
Eosinophils Relative: 1 %
HEMATOCRIT: 39.3 % (ref 39.0–52.0)
Hemoglobin: 13.4 g/dL (ref 13.0–17.0)
LYMPHS ABS: 1.9 10*3/uL (ref 0.7–4.0)
LYMPHS PCT: 16 %
MCH: 30.5 pg (ref 26.0–34.0)
MCHC: 34.1 g/dL (ref 30.0–36.0)
MCV: 89.3 fL (ref 78.0–100.0)
MONO ABS: 1.4 10*3/uL — AB (ref 0.1–1.0)
MONOS PCT: 12 %
NEUTROS ABS: 8.3 10*3/uL — AB (ref 1.7–7.7)
Neutrophils Relative %: 71 %
Platelets: 374 10*3/uL (ref 150–400)
RBC: 4.4 MIL/uL (ref 4.22–5.81)
RDW: 14.3 % (ref 11.5–15.5)
WBC: 11.6 10*3/uL — ABNORMAL HIGH (ref 4.0–10.5)

## 2016-03-21 LAB — I-STAT TROPONIN, ED: Troponin i, poc: 0.01 ng/mL (ref 0.00–0.08)

## 2016-03-21 LAB — ETHANOL

## 2016-03-21 MED ORDER — SODIUM CHLORIDE 0.9 % IV BOLUS (SEPSIS)
1000.0000 mL | Freq: Once | INTRAVENOUS | Status: AC
  Administered 2016-03-21: 1000 mL via INTRAVENOUS

## 2016-03-21 NOTE — Discharge Instructions (Signed)
Stay hydrated.   Drink plenty of gatorade.   See your doctor   Stop drinking alcohol   Return to ER if you have worse dizziness, light headedness, passing out.

## 2016-03-21 NOTE — ED Provider Notes (Signed)
[facility]-EMERGENCY DEPT Provider Note   CSN: [id number] Arrival date & time: 03/21/16  0813     History   Chief Complaint Chief Complaint  Patient presents with  . Dizziness    HPI Joseph Garza is a 45 y.o. male hx of vasovagal syncope, Here is presenting with lightheadedness and dizziness. Patient states that he felt lightheaded and dizzy today. Patient states that when he stands up he felt lightheaded and dizzy like he is on pass out. Denies any chest pain or shortness of breath. Does drink water very frequently and denies any vomiting or fevers. Patient states that he came here several months ago for near syncope and was thought to have dehydration and given a bag of fluids and sent home. Patient does drink alcohol occasionally more on the weekends but denies chronic drinking issues.    The history is provided by the patient.    Past Medical History:  Diagnosis Date  . MRSA (methicillin resistant Staphylococcus aureus)     There are no active problems to display for this patient.   History reviewed. No pertinent surgical history.     Home Medications    Prior to Admission medications   Not on File    Family History No family history on file.  Social History Social History  Substance Use Topics  . Smoking status: Current Every Day Smoker    Packs/day: 0.50    Types: Cigarettes  . Smokeless tobacco: Never Used  . Alcohol use Yes     Comment: six pack a week     Allergies   Penicillins   Review of Systems Review of Systems  Neurological: Positive for dizziness.  All other systems reviewed and are negative.    Physical Exam Updated Vital Signs BP 111/73   Pulse 80   Temp 98 F (36.7 C) (Oral)   Resp 20   Ht 6' (1.829 m)   Wt 185 lb (83.9 kg)   SpO2 98%   BMI 25.09 kg/m   Physical Exam  Constitutional: He appears well-developed and well-nourished.  HENT:  Head: Normocephalic.  MM slightly dry   Eyes: EOM are normal. Pupils are equal,  round, and reactive to light.  Neck: Normal range of motion. Neck supple.  Cardiovascular: Normal rate, regular rhythm and normal heart sounds.   Pulmonary/Chest: Effort normal and breath sounds normal. No respiratory distress. He has no wheezes. He has no rales.  Abdominal: Soft. Bowel sounds are normal. He exhibits no distension. There is no tenderness. There is no guarding.  Musculoskeletal: Normal range of motion.  Neurological: He is alert.  Skin: Skin is warm.  Psychiatric: He has a normal mood and affect.  Nursing note and vitals reviewed.    ED Treatments / Results  Labs (all labs ordered are listed, but only abnormal results are displayed) Labs Reviewed  CBC WITH DIFFERENTIAL/PLATELET - Abnormal; Notable for the following:       Result Value   WBC 11.6 (*)    Neutro Abs 8.3 (*)    Monocytes Absolute 1.4 (*)    All other components within normal limits  COMPREHENSIVE METABOLIC PANEL - Abnormal; Notable for the following:    Glucose, Bld 116 (*)    Total Protein 6.3 (*)    All other components within normal limits  ETHANOL  Rosezena SensorI-STAT TROPOININ, ED    EKG  EKG Interpretation  Date/Time:  Thursday March 21 2016 08:58:53 EDT Ventricular Rate:  63 PR Interval:    QRS Duration:  95 QT Interval:  392 QTC Calculation: 402 R Axis:   51 Text Interpretation:  Sinus rhythm No significant change since last tracing Confirmed by YAO  MD, DAVID ([id number]) on 03/21/2016 9:38:13 AM       Radiology Ct Head Wo Contrast  Result Date: 03/21/2016 CLINICAL DATA:  Dizziness and lightheadedness since this morning. Some leg weakness. EXAM: CT HEAD WITHOUT CONTRAST TECHNIQUE: Contiguous axial images were obtained from the base of the skull through the vertex without intravenous contrast. COMPARISON:  None. FINDINGS: Brain: No evidence of acute infarction, hemorrhage, hydrocephalus, extra-axial collection or mass lesion/mass effect. Developmental appearing asymmetry of the lateral ventricles.  Vascular: No hyperdense vessel or unexpected calcification. Skull: Normal. Negative for fracture or focal lesion. Sinuses/Orbits: No acute finding. IMPRESSION: Negative head CT. Electronically Signed   By: Marnee Spring M.D.   On: 03/21/2016 10:18    Procedures Procedures (including critical care time)  Medications Ordered in ED Medications  sodium chloride 0.9 % bolus 1,000 mL (1,000 mLs Intravenous New Bag/Given 03/21/16 0914)     Initial Impression / Assessment and Plan / ED Course  I have reviewed the triage vital signs and the nursing notes.  Pertinent labs & imaging results that were available during my care of the patient were reviewed by me and considered in my medical decision making (see chart for details).  Clinical Course    Joseph Garza is a 45 y.o. male here with dizziness, light headedness. Likely mild dehydration. Has chronic dizziness and drinks alcohol so will get CT head but has nl neuro exam. Will get labs, CT head, ETOH.   11:07 AM Labs and CT head unremarkable. Not orthostatic. Given IVF and felt better. Recommend stop drinking alcohol, try gatorade at home    Final Clinical Impressions(s) / ED Diagnoses   Final diagnoses:  None    New Prescriptions New Prescriptions   No medications on file     Charlynne Pander, MD 03/21/16 [phone]

## 2016-03-21 NOTE — ED Triage Notes (Addendum)
Patient states this morning upon waking he felt okay, but when he stood up and started moving around, he felt light headed.  Patient states he drinks about 42 ounces of water each day and has been eating and drinking per baseline.  Patient denies pain and SOB.  Patient denies N/V/D, fever and a dry cough.  Patient states he had an episode similar to this in May and was seen here in the ED for hydration.

## 2016-09-25 IMAGING — CR DG ANKLE COMPLETE 3+V*R*
3 series · 3 of 3 positions shown · non-contrast
Comparison: None.

CLINICAL DATA: Basketball injury last night as rolled right
foot/ankle.

EXAM:
RIGHT ANKLE - COMPLETE 3+ VIEW

[ankle ap]
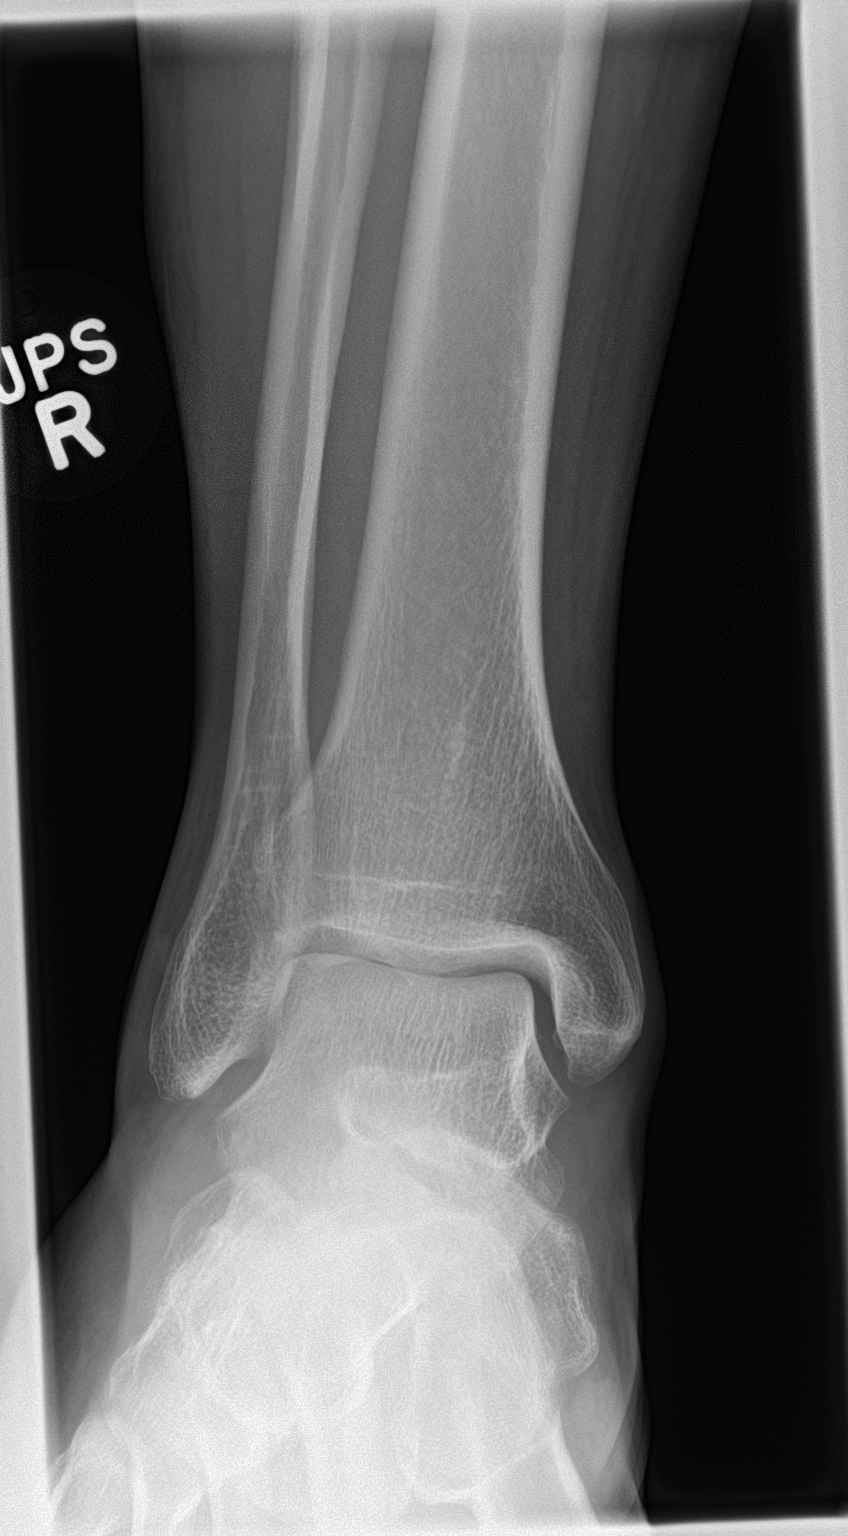

[ankle obl]
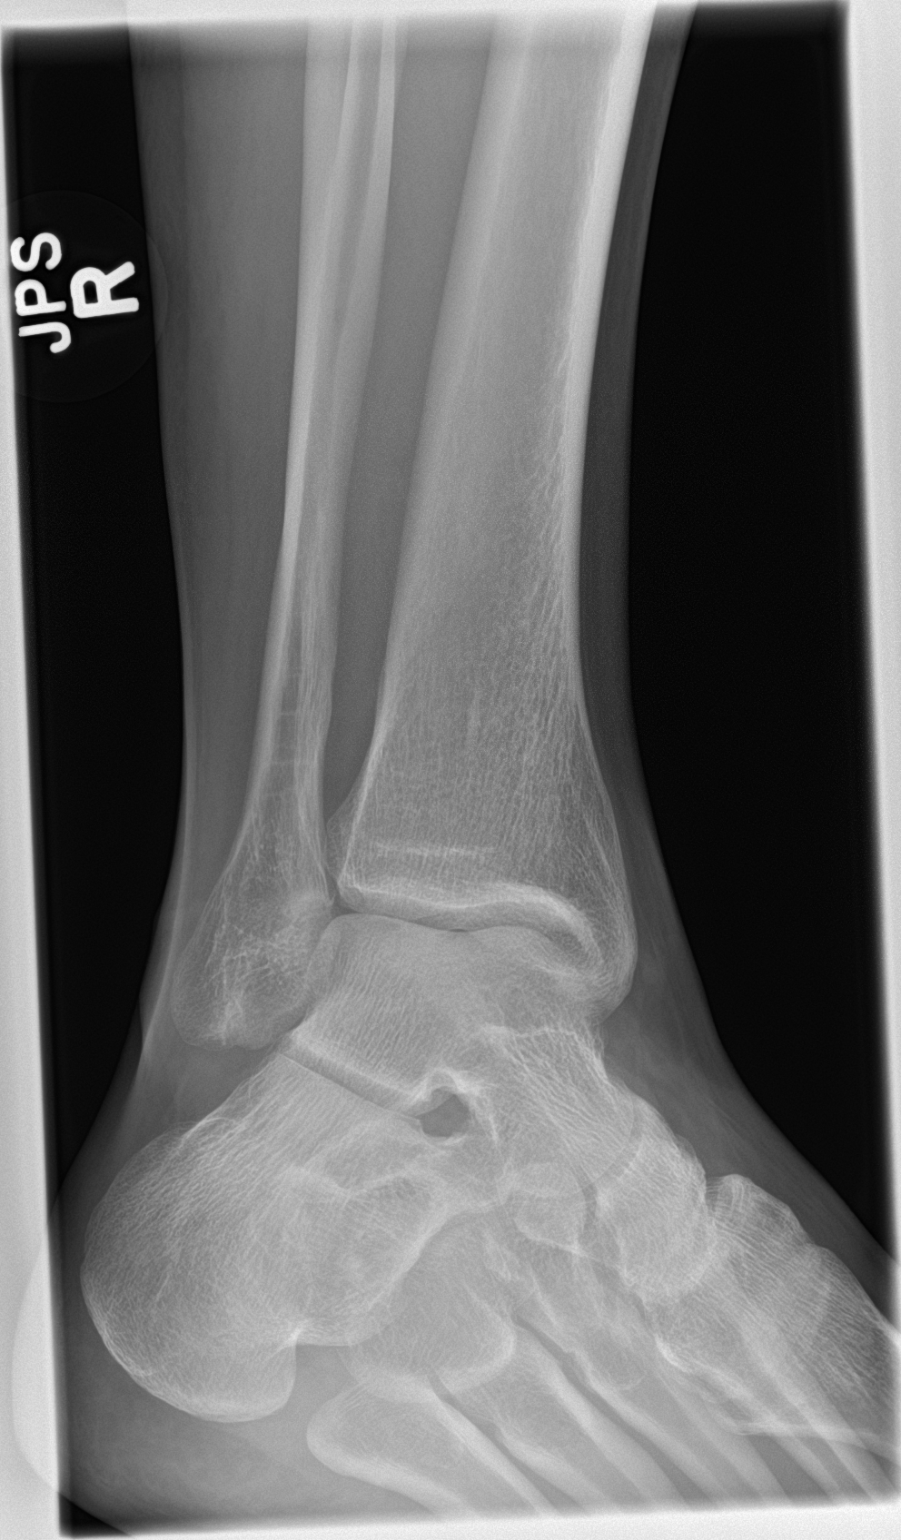

[ankle lat]
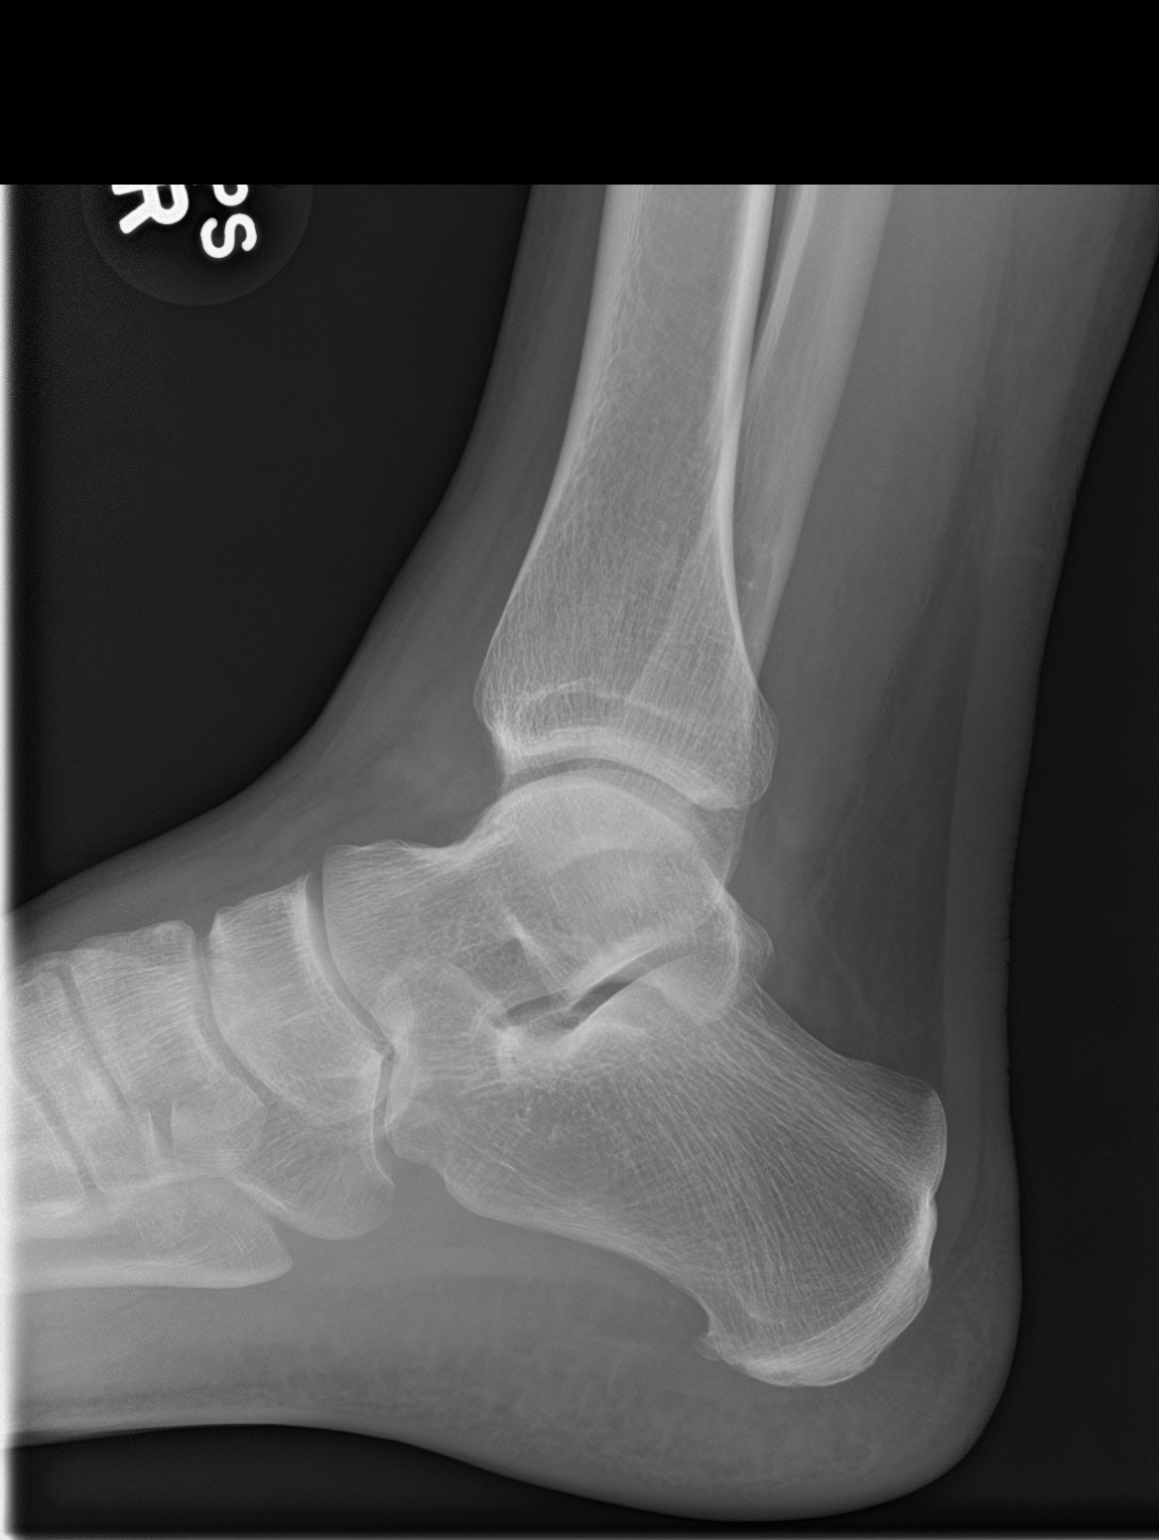

[3 of 3 positions shown; findings below may reference images not displayed]

FINDINGS: There is no evidence of fracture, dislocation, or joint effusion.
There is no evidence of arthropathy or other focal bone abnormality.
Soft tissues are unremarkable.
IMPRESSION: Negative.

## 2017-06-08 ENCOUNTER — Encounter (HOSPITAL_COMMUNITY): Payer: Self-pay | Admitting: Emergency Medicine

## 2017-06-08 ENCOUNTER — Emergency Department (HOSPITAL_COMMUNITY)
Admission: EM | Admit: 2017-06-08 | Discharge: 2017-06-08 | Disposition: A | Discharge status: Home / Self Care | Payer: Self-pay | Attending: Emergency Medicine | Admitting: Emergency Medicine

## 2017-06-08 DIAGNOSIS — F17210 Nicotine dependence, cigarettes, uncomplicated: Secondary | ICD-10-CM | POA: Insufficient documentation

## 2017-06-08 DIAGNOSIS — R7989 Other specified abnormal findings of blood chemistry: Secondary | ICD-10-CM | POA: Insufficient documentation

## 2017-06-08 DIAGNOSIS — I951 Orthostatic hypotension: Secondary | ICD-10-CM | POA: Insufficient documentation

## 2017-06-08 LAB — CBG MONITORING, ED: Glucose-Capillary: 107 mg/dL — ABNORMAL HIGH (ref 65–99)

## 2017-06-08 LAB — HEPATIC FUNCTION PANEL
ALT: 21 U/L (ref 17–63)
AST: 18 U/L (ref 15–41)
Albumin: 4.2 g/dL (ref 3.5–5.0)
Alkaline Phosphatase: 86 U/L (ref 38–126)
Bilirubin, Direct: <0.1 mg/dL — ABNORMAL LOW (ref 0.1–0.5)
TOTAL PROTEIN: 7.6 g/dL (ref 6.5–8.1)
Total Bilirubin: 0.7 mg/dL (ref 0.3–1.2)

## 2017-06-08 LAB — CBC
HEMATOCRIT: 41.3 % (ref 39.0–52.0)
Hemoglobin: 14 g/dL (ref 13.0–17.0)
MCH: 31.2 pg (ref 26.0–34.0)
MCHC: 33.9 g/dL (ref 30.0–36.0)
MCV: 92 fL (ref 78.0–100.0)
Platelets: 372 10*3/uL (ref 150–400)
RBC: 4.49 MIL/uL (ref 4.22–5.81)
RDW: 14.1 % (ref 11.5–15.5)
WBC: 12.1 10*3/uL — ABNORMAL HIGH (ref 4.0–10.5)

## 2017-06-08 LAB — BASIC METABOLIC PANEL
ANION GAP: 6 (ref 5–15)
BUN: 10 mg/dL (ref 6–20)
CHLORIDE: 106 mmol/L (ref 101–111)
CO2: 25 mmol/L (ref 22–32)
Calcium: 10 mg/dL (ref 8.9–10.3)
Creatinine, Ser: 1.31 mg/dL — ABNORMAL HIGH (ref 0.61–1.24)
Glucose, Bld: 110 mg/dL — ABNORMAL HIGH (ref 65–99)
POTASSIUM: 5 mmol/L (ref 3.5–5.1)
SODIUM: 137 mmol/L (ref 135–145)

## 2017-06-08 LAB — I-STAT TROPONIN, ED: TROPONIN I, POC: 0 ng/mL (ref 0.00–0.08)

## 2017-06-08 MED ORDER — SODIUM CHLORIDE 0.9 % IV BOLUS (SEPSIS): 1000.0000 mL | Freq: Once | INTRAVENOUS | Status: DC

## 2017-06-08 MED ORDER — SODIUM CHLORIDE 0.9 % IV BOLUS (SEPSIS)
1000.0000 mL | Freq: Once | INTRAVENOUS | Status: AC
  Administered 2017-06-08: 1000 mL via INTRAVENOUS

## 2017-06-08 NOTE — ED Provider Notes (Signed)
[facility] Provider Note   CSN: [id number] Arrival date & time: 06/08/17  1323     History   Chief Complaint Chief Complaint  Patient presents with  . Hypertension  . Near Syncope    HPI Joseph Garza is a 46 y.o. male who presents to the emergency department with a chief complaint of diaphoresis with associated lightheadedness that began this morning after eating breakfast.  The patient reports that he was feeling at his baseline yesterday, and this morning said that he was feeling "off"so he went to his neighbor's house to check his blood pressure and received a reading of 148/95.  On repeat check is 144/95.  The patient reports his baseline blood pressure is 120/80.  He reports that after he checked his blood pressure for the second time that he began to feel as if he was working harder to breathe that would last for approximately 3 minutes before resolving spontaneously; however he denies dyspnea.   No aggravating or alleviating factors.  He reports that his lightheadedness continued so he came to the emergency department for evaluation.  Nursing staff reports a vasovagal episode while the patient was having his blood drawn.  He has no complaints at this time.  He states that when he felt like this before that he was dehydrated and had to come into the ED for fluids.  He reports PO intake consistent with sodas, tea, and fast food. He reports he travels a lot for his new job, and has not been hydrating as well as he previously did at his old job. He denies fever, chills, nausea, vomiting, diarrhea, abdominal pain, chest pain, dizziness, or headache.  No chronic past medical history.  No daily medications.  The history is provided by the patient. No language interpreter was used.    Past Medical History:  Diagnosis Date  . MRSA (methicillin resistant Staphylococcus aureus)     There are no active problems to display for this patient.   No  past surgical history on file.     Home Medications    Prior to Admission medications   Not on File    Family History No family history on file.  Social History Social History   Tobacco Use  . Smoking status: Current Every Day Smoker    Packs/day: 0.50    Types: Cigarettes  . Smokeless tobacco: Never Used  Substance Use Topics  . Alcohol use: Yes    Comment: six pack a week  . Drug use: No     Allergies   Penicillins   Review of Systems Review of Systems  Constitutional: Positive for diaphoresis. Negative for activity change, chills and fever.  Eyes: Negative for visual disturbance.  Respiratory: Negative for shortness of breath.   Cardiovascular: Negative for chest pain.  Gastrointestinal: Negative for abdominal pain, diarrhea, nausea and vomiting.  Genitourinary: Negative for dysuria.  Musculoskeletal: Negative for back pain.  Skin: Negative for rash.  Allergic/Immunologic: Negative for immunocompromised state.  Neurological: Positive for syncope and light-headedness. Negative for dizziness, weakness and headaches.  Psychiatric/Behavioral: Negative for confusion.     Physical Exam Updated Vital Signs BP (!) 126/97   Pulse 70   Temp 98 F (36.7 C)   Resp 16   Ht 6' (1.829 m)   Wt 79.4 kg (175 lb)   SpO2 96%   BMI 23.73 kg/m   Physical Exam  Constitutional: He appears well-developed.  Appears mildly jaundiced.  HENT:  Head: Normocephalic.  Nose: Nose normal. Right sinus exhibits no maxillary sinus tenderness and no frontal sinus tenderness. Left sinus exhibits no maxillary sinus tenderness and no frontal sinus tenderness.  Mouth/Throat: Uvula is midline and oropharynx is clear and moist.  Cerumen noted in the bilateral canals.  Eyes: Conjunctivae are normal. No scleral icterus.  Neck: Normal range of motion. Neck supple. No tracheal deviation present. No thyromegaly present.  Cardiovascular: Normal rate, regular rhythm, normal heart sounds and  intact distal pulses. Exam reveals no gallop and no friction rub.  No murmur heard. Pulmonary/Chest: Effort normal and breath sounds normal. No stridor. No respiratory distress. He has no wheezes. He has no rales. He exhibits no tenderness.  Abdominal: Soft. Bowel sounds are normal. He exhibits no distension and no mass. There is no tenderness. There is no rebound and no guarding. No hernia.  Musculoskeletal: Normal range of motion. He exhibits no edema, tenderness or deformity.  Neurological: He is alert.  Cranial nerves 2-12 intact. Finger-to-nose is normal. 5/5 motor strength of the bilateral upper and lower extremities. Moves all four extremities. Negative Romberg.  No pronator drift.  Symmetric tandem gait.  NVI.    Skin: Skin is warm and dry.  Psychiatric: His behavior is normal.  Nursing note and vitals reviewed.    ED Treatments / Results  Labs (all labs ordered are listed, but only abnormal results are displayed) Labs Reviewed  BASIC METABOLIC PANEL - Abnormal; Notable for the following components:      Result Value   Glucose, Bld 110 (*)    Creatinine, Ser 1.31 (*)    All other components within normal limits  CBC - Abnormal; Notable for the following components:   WBC 12.1 (*)    All other components within normal limits  HEPATIC FUNCTION PANEL - Abnormal; Notable for the following components:   Bilirubin, Direct <0.1 (*)    All other components within normal limits  CBG MONITORING, ED - Abnormal; Notable for the following components:   Glucose-Capillary 107 (*)    All other components within normal limits  URINALYSIS, ROUTINE W REFLEX MICROSCOPIC  I-STAT TROPONIN, ED    EKG  EKG Interpretation None       Radiology No results found.  Procedures Procedures (including critical care time)  Medications Ordered in ED Medications  sodium chloride 0.9 % bolus 1,000 mL (0 mLs Intravenous Stopped 06/08/17 1721)     Initial Impression / Assessment and Plan / ED  Course  I have reviewed the triage vital signs and the nursing notes.  Pertinent labs & imaging results that were available during my care of the patient were reviewed by me and considered in my medical decision making (see chart for details).    46 year old male presenting with lightheadedness and diaphoresis.  The patient had a vasovagal episode in triage.  Positive orthostatics of 103/68 lying, 94/63/ sitting and 73/51 standing.  Creatinine elevated from his baseline of 1.0 to 1.31.  IV fluid bolus given.  Repeat orthostatics are negative.  Discussed with the patient adding another IVF bolus and rechecking his blood pressure, which he decline because "he is hungry and wants cook out."  He was successfully p.o. challenged in the emergency department.  He is asymptomatic with ambulation.  LFTS are unremarkable. Labs are otherwise reassuring.  [facility] for creatinine recheck in the next week.  Strict return precautions given.  No acute distress.  The patient is safe for discharge at this time.  Final Clinical Impressions(s) / ED Diagnoses   Final diagnoses:  Elevated serum creatinine  Orthostatic hypotension    ED Discharge Orders    None       Barkley Boards, PA-C 06/08/17 1750    Rolan Bucco, MD 06/09/17 1322

## 2017-06-08 NOTE — ED Notes (Signed)
Pt became pale and diaphoretic after blood draw. Pt color is back to his baseline (slightly jaundiced) and states he feels better.

## 2017-06-08 NOTE — Discharge Instructions (Signed)
Your creatinine levels are elevated today, which I suspect is the cause of your symptoms. Staying hydrated with fluids is important to make sure you don't get dehydrated or cause your creatinine to get elevated.  Please call [place] [facility] to schedule follow-up appointment in the next week to have your creatinine levels rechecked.  If you develop new or worsening symptoms, including, shortness of breath, chest pain, lightheadedness, or other concerning symptoms, please return to the emergency department for reevaluation.

## 2017-06-08 NOTE — ED Triage Notes (Addendum)
Pt states he has felt weak, with no chest pain no abdominal pain, no neurological symptoms. States he noted his blood pressure was high (140/90) at home. States "I think I am dehydrated, one time I came here and got fluids for it and felt better." Pt states no hx of kidney disease and states he has been eating a drinking per usual. Denies urinary symptoms. VAN negative.

## 2017-06-08 NOTE — ED Notes (Signed)
Urine collection cup at bedside.  Pt aware urine is needed

## 2018-05-04 ENCOUNTER — Other Ambulatory Visit: Payer: Self-pay

## 2018-05-04 ENCOUNTER — Encounter (HOSPITAL_COMMUNITY): Payer: Self-pay

## 2018-05-04 ENCOUNTER — Emergency Department (HOSPITAL_COMMUNITY)
Admission: EM | Admit: 2018-05-04 | Discharge: 2018-05-04 | Disposition: A | Discharge status: Home / Self Care | Payer: Self-pay | Attending: Emergency Medicine | Admitting: Emergency Medicine

## 2018-05-04 DIAGNOSIS — H66003 Acute suppurative otitis media without spontaneous rupture of ear drum, bilateral: Secondary | ICD-10-CM | POA: Insufficient documentation

## 2018-05-04 DIAGNOSIS — F17210 Nicotine dependence, cigarettes, uncomplicated: Secondary | ICD-10-CM | POA: Insufficient documentation

## 2018-05-04 MED ORDER — AMOXICILLIN-POT CLAVULANATE 875-125 MG PO TABS
1.0000 | ORAL_TABLET | Freq: Two times a day (BID) | ORAL | 0 refills | Status: AC
Start: 2018-05-04 — End: 2018-05-14

## 2018-05-04 NOTE — ED Notes (Signed)
ED Provider at bedside. 

## 2018-05-04 NOTE — ED Triage Notes (Signed)
Pt from home with PTAR c.o ringing in his ears for 1 week, pain in left ear. Denies drainage. Pt reports intermittent dizziness. Taking sudafed at home without relief, none taken today. Pt a.o, nad noted.

## 2018-05-04 NOTE — ED Provider Notes (Signed)
Medical screening examination/treatment/procedure(s) were conducted as a shared visit with non-physician practitioner(s) and myself.  I personally evaluated the patient during the encounter.  Clinical Impression:   Final diagnoses:  Acute suppurative otitis media of both ears without spontaneous rupture of tympanic membranes, recurrence not specified    Pt has 1 week of progressive hearing loss and ear pain after having URI - is not febrile - takes no daily meds / no allergies, stopped smoking 2 days ago   On exam has bilateral OM purulent / suppurative bulging TM's - no signs of perforation - no OE.  No ttp over mastoid and no swelling or erythema of same, no torticollis or trismus.  10 days of augmentin Pt in agreement Stable for d/c No immunocompromise.   Eber Hong, MD 05/05/18 [phone]

## 2018-05-04 NOTE — ED Notes (Signed)
Patient verbalizes understanding of discharge instructions. Opportunity for questioning and answers were provided. Armband removed by staff, pt discharged from ED.  

## 2018-05-04 NOTE — ED Provider Notes (Signed)
[facility] EMERGENCY DEPARTMENT Provider Note   CSN: [id number] Arrival date & time: 05/04/18  1143     History   Chief Complaint Chief Complaint  Patient presents with  . Tinnitus  . Dizziness    HPI Joseph Garza is a 47 y.o. male presenting with constant bilateral tinnitus and hearing loss onset 1 week ago. Patient also reports bilateral intermittent non radiating ear pain. Patient states ear pain is worse on the left than the right. Patient reports he has had cold symptoms including rhinorrhea, sore throat, subjective fevers, cough, light-headedness, and headache. Patient has tried Sudafed without relief. Patient denies, ear drainage, swimming, or ear trauma.  HPI  Past Medical History:  Diagnosis Date  . MRSA (methicillin resistant Staphylococcus aureus)     There are no active problems to display for this patient.   History reviewed. No pertinent surgical history.      Home Medications    Prior to Admission medications   Medication Sig Start Date End Date Taking? Authorizing Provider  amoxicillin-clavulanate (AUGMENTIN) 875-125 MG tablet Take 1 tablet by mouth 2 (two) times daily for 10 days. 05/04/18 05/14/18  Leretha Dykes, PA-C    Family History No family history on file.  Social History Social History   Tobacco Use  . Smoking status: Current Every Day Smoker    Packs/day: 0.50    Types: Cigarettes  . Smokeless tobacco: Never Used  Substance Use Topics  . Alcohol use: Yes    Comment: six pack a week  . Drug use: No     Allergies   Penicillins   Review of Systems Review of Systems  Constitutional: Positive for fever (Pt reports subjective fevers.). Negative for activity change, appetite change and fatigue.  HENT: Positive for congestion, ear pain (Pt reports bilateral ear pain.), rhinorrhea, sore throat and tinnitus. Negative for ear discharge and postnasal drip.   Eyes: Negative for redness.  Respiratory: Negative  for cough and shortness of breath.   Cardiovascular: Negative for chest pain.  Gastrointestinal: Negative for abdominal pain, diarrhea, nausea and vomiting.  Musculoskeletal: Negative for myalgias.  Skin: Negative for rash.  Allergic/Immunologic: Negative for environmental allergies and immunocompromised state.  Neurological: Positive for light-headedness. Negative for dizziness, weakness and headaches.     Physical Exam Updated Vital Signs BP 107/72 (BP Location: Right Arm)   Pulse 96   Temp 98.6 F (37 C) (Oral)   Resp 17   Ht 6' (1.829 m)   Wt 81.6 kg   SpO2 97%   BMI 24.41 kg/m   Physical Exam  Constitutional: He appears well-developed and well-nourished. No distress.  HENT:  Head: Normocephalic and atraumatic.  Right Ear: No drainage or tenderness. No mastoid tenderness. A middle ear effusion is present. Decreased hearing is noted.  Left Ear: No drainage or tenderness. There is mastoid tenderness. A middle ear effusion is present. Decreased hearing is noted.  Nose: Mucosal edema and rhinorrhea present.  Mouth/Throat: Uvula is midline. Posterior oropharyngeal erythema present. No oropharyngeal exudate or posterior oropharyngeal edema.  Erythema noted around canals bilaterally. Bulging and purulent material noted behind the TMs bilaterally.   Eyes: Conjunctivae are normal. Right eye exhibits no discharge. Left eye exhibits no discharge.  Neck: Normal range of motion. Neck supple.  Cardiovascular: Normal rate, regular rhythm and normal heart sounds. Exam reveals no gallop and no friction rub.  No murmur heard. Pulmonary/Chest: Effort normal and breath sounds normal. No respiratory distress. He has no wheezes. He  has no rales.  Abdominal: Soft. There is no tenderness.  Musculoskeletal: Normal range of motion.  Lymphadenopathy:    He has no cervical adenopathy.  Neurological: He is alert.  Skin: No rash noted. He is not diaphoretic.  Psychiatric: He has a normal mood and  affect.  Nursing note and vitals reviewed.    ED Treatments / Results  Labs (all labs ordered are listed, but only abnormal results are displayed) Labs Reviewed - No data to display  EKG None  Radiology No results found.  Procedures Procedures (including critical care time)  Medications Ordered in ED Medications - No data to display   Initial Impression / Assessment and Plan / ED Course  I have reviewed the triage vital signs and the nursing notes.  Pertinent labs & imaging results that were available during my care of the patient were reviewed by me and considered in my medical decision making (see chart for details).    Patient presents with otalgia and exam consistent with acute otitis media. Patient discharged home with Augmentin due to mastoid tenderness. Patient denies allergy to penicillins. I have also discussed reasons to return immediately to the ER.  Parent expresses understanding and agrees with plan.   Final Clinical Impressions(s) / ED Diagnoses   Final diagnoses:  Acute suppurative otitis media of both ears without spontaneous rupture of tympanic membranes, recurrence not specified    ED Discharge Orders         Ordered    amoxicillin-clavulanate (AUGMENTIN) 875-125 MG tablet  2 times daily     05/04/18 1244           Carlyle Basques [place], [place] 05/04/18 1247    Eber Hong, MD 05/05/18 [phone]

## 2018-05-04 NOTE — Discharge Instructions (Addendum)
You have been seen today for ear pain, ringing, and hearing loss. Please read and follow all provided instructions.   1. Medications: Augmentin (antibiotic), usual home medications 2. Treatment: rest, drink plenty of fluids. 3. Follow Up: Please follow up with your primary doctor in 2 days for discussion of your diagnoses and further evaluation after today's visit; if you do not have a primary care doctor use the resource guide provided to find one; Please return to the ER for any new or worsening symptoms.   Take medications as prescribed. Return to the emergency room for worsening condition or new concerning symptoms. Follow up with your regular doctor. If you don't have a regular doctor use one of the numbers below to establish a primary care doctor.   Emergency Department Resource Guide 1) Find a Doctor and Pay Out of Pocket Although you won't have to find out who is covered by your insurance plan, it is a good idea to ask around and get recommendations. You will then need to call the office and see if the doctor you have chosen will accept you as a new patient and what types of options they offer for patients who are self-pay. Some doctors offer discounts or will set up payment plans for their patients who do not have insurance, but you will need to ask so you aren't surprised when you get to your appointment.  2) Contact Your Local Health Department Not all health departments have doctors that can see patients for sick visits, but many do, so it is worth a call to see if yours does. If you don't know where your local health department is, you can check in your phone book. The CDC also has a tool to help you locate your state's health department, and many state websites also have listings of all of their local health departments.  3) Find a Walk-in Clinic If your illness is not likely to be very severe or complicated, you may want to try a walk in clinic. These are popping up all over the  country in pharmacies, drugstores, and shopping centers. They're usually staffed by nurse practitioners or physician assistants that have been trained to treat common illnesses and complaints. They're usually fairly quick and inexpensive. However, if you have serious medical issues or chronic medical problems, these are probably not your best option.  No Primary Care Doctor: Call Health Connect at  [phone] - they can help you locate a primary care doctor that  accepts your insurance, provides certain services, etc. Physician Referral [phone]  Emergency Department Resource Guide 1) Find a Doctor and Pay Out of Pocket Although you won't have to find out who is covered by your insurance plan, it is a good idea to ask around and get recommendations. You will then need to call the office and see if the doctor you have chosen will accept you as a new patient and what types of options they offer for patients who are self-pay. Some doctors offer discounts or will set up payment plans for their patients who do not have insurance, but you will need to ask so you aren't surprised when you get to your appointment.  2) Contact Your Local Health Department Not all health departments have doctors that can see patients for sick visits, but many do, so it is worth a call to see if yours does. If you don't know where your local health department is, you can check in your phone book. The CDC also has  a tool to help you locate your state's health department, and many state websites also have listings of all of their local health departments.  3) Find a Walk-in Clinic If your illness is not likely to be very severe or complicated, you may want to try a walk in clinic. These are popping up all over the country in pharmacies, drugstores, and shopping centers. They're usually staffed by nurse practitioners or physician assistants that have been trained to treat common illnesses and complaints. They're usually  fairly quick and inexpensive. However, if you have serious medical issues or chronic medical problems, these are probably not your best option.  No Primary Care Doctor: Call Health Connect at  [phone] - they can help you locate a primary care doctor that  accepts your insurance, provides certain services, etc. Physician Referral Service- [phone]  Chronic Pain Problems: Organization         Address  Phone   Notes  [facility]  [phone] Patients need to be referred by their primary care doctor.   Medication Assistance: Organization         Address  Phone   Notes  [facility] Medication [facility] [address] [place] [place], [place] [address] [phone] --Must be a resident of [facility] -- Must have NO insurance coverage whatsoever (no Medicaid/ Medicare, etc.) -- The pt. MUST have a primary care doctor that directs their care regularly and follows them in the community   [facility]  [phone]   [employer]  [phone]    Agencies that provide inexpensive medical care: Organization         Address  Phone   Notes  [facility]  [phone]   [facility]    [phone]   [facility] [address] [place], [place] [address] [phone]   [facility] [address] [place], [place] [phone]   [facility]anned [facility]    [phone]   [facility]    [phone]   [facility]  [address], [place] Phone:  [phone], Fax:  [phone] Hours of Operation:  9 am - 6 pm, M-F.  Also accepts Medicaid/Medicare and self-pay.  [facility]  [address], [place] Phone: [phone], Fax: [phone]. Hours of Operation:  8:30 am - 5:30 pm, M-F.  Also accepts Medicaid and self-pay.  [facility] [place] [address], [place] Phone: [phone]   [facility] [address] Natasha Bence [place], [place] [phone], Ext. [phone] Mondays & Thursdays: 7-9 AM.  First 15 patients are seen on a first come, first serve basis.    Medicaid-accepting [facility] Providers:  Organization         Address  Phone   Notes  [facility] [address], [place] [phone] Also accepts self-pay patients.  [facility] [address] Laurell Josephs [place], [place]  [phone]   [facility] [address], [place] [phone]   [facility] [address] [place] [phone]   Renaye Rakers [address], [place]   [phone] Only accepts [place] patients after they have their name applied to their card.   Self-Pay (no insurance) in [place]:  Organization         Address  Phone   Notes  Sickle Cell Patients, [facility] [place] [phone]   [facility] [place] [phone]   [facility] [place]  [place], [address],  [phone]   [facility]/Dr. Osei-Bonsu  [address] [place] [address], [place] [phone] Phone number for both [place] and [place] locations is the same.  [facility] [address] [place] [phone]   [facility] [address], [place] or [address] [phone]   [facility] [address] [place] [phone], phone; [phone], fax Sees patients 1st and 3rd Saturday of every month.  Must not qualify for public or private insurance (i.e. Medicaid, Medicare, [place] Health Choice, Veterans' Benefits)  Household income should be no more than 200% of the poverty level The clinic cannot treat you if  you are pregnant or think you are pregnant  Sexually transmitted diseases are not treated at the clinic.

## 2018-05-07 ENCOUNTER — Inpatient Hospital Stay: Payer: Self-pay

## 2019-10-20 ENCOUNTER — Ambulatory Visit: Payer: Self-pay | Attending: Internal Medicine

## 2019-10-20 ENCOUNTER — Other Ambulatory Visit: Payer: Self-pay

## 2019-10-20 DIAGNOSIS — Z20822 Contact with and (suspected) exposure to covid-19: Secondary | ICD-10-CM | POA: Insufficient documentation

## 2019-10-21 LAB — NOVEL CORONAVIRUS, NAA: SARS-CoV-2, NAA: NOT DETECTED

## 2019-10-21 LAB — SARS-COV-2, NAA 2 DAY TAT

## 2021-07-30 ENCOUNTER — Ambulatory Visit (HOSPITAL_COMMUNITY): Payer: No Payment, Other | Admitting: Licensed Clinical Social Worker

## 2022-03-13 DIAGNOSIS — M25571 Pain in right ankle and joints of right foot: Secondary | ICD-10-CM | POA: Diagnosis not present

## 2022-03-13 DIAGNOSIS — Z13220 Encounter for screening for lipoid disorders: Secondary | ICD-10-CM | POA: Diagnosis not present

## 2022-03-13 DIAGNOSIS — Z0000 Encounter for general adult medical examination without abnormal findings: Secondary | ICD-10-CM | POA: Diagnosis not present

## 2022-03-13 DIAGNOSIS — G4700 Insomnia, unspecified: Secondary | ICD-10-CM | POA: Diagnosis not present

## 2022-03-13 DIAGNOSIS — F418 Other specified anxiety disorders: Secondary | ICD-10-CM | POA: Diagnosis not present

## 2022-03-13 DIAGNOSIS — Z0001 Encounter for general adult medical examination with abnormal findings: Secondary | ICD-10-CM | POA: Diagnosis not present

## 2022-04-12 DIAGNOSIS — G4700 Insomnia, unspecified: Secondary | ICD-10-CM | POA: Diagnosis not present

## 2022-04-12 DIAGNOSIS — F418 Other specified anxiety disorders: Secondary | ICD-10-CM | POA: Diagnosis not present

## 2022-07-18 DIAGNOSIS — Z1211 Encounter for screening for malignant neoplasm of colon: Secondary | ICD-10-CM | POA: Diagnosis not present

## 2022-07-18 DIAGNOSIS — K648 Other hemorrhoids: Secondary | ICD-10-CM | POA: Diagnosis not present

## 2022-07-18 DIAGNOSIS — K635 Polyp of colon: Secondary | ICD-10-CM | POA: Diagnosis not present

## 2022-10-11 DIAGNOSIS — F418 Other specified anxiety disorders: Secondary | ICD-10-CM | POA: Diagnosis not present

## 2022-10-11 DIAGNOSIS — G4700 Insomnia, unspecified: Secondary | ICD-10-CM | POA: Diagnosis not present

## 2023-02-15 ENCOUNTER — Ambulatory Visit (HOSPITAL_COMMUNITY)
Admission: RE | Admit: 2023-02-15 | Discharge: 2023-02-15 | Disposition: A | Discharge status: Home / Self Care | Payer: 59 | Source: Ambulatory Visit | Attending: Family Medicine | Admitting: Family Medicine

## 2023-02-15 ENCOUNTER — Ambulatory Visit (INDEPENDENT_AMBULATORY_CARE_PROVIDER_SITE_OTHER): Payer: 59

## 2023-02-15 ENCOUNTER — Other Ambulatory Visit: Payer: Self-pay

## 2023-02-15 ENCOUNTER — Encounter (HOSPITAL_COMMUNITY): Payer: Self-pay

## 2023-02-15 VITALS — BP 114/80 | HR 85 | Temp 98.6°F | Resp 20

## 2023-02-15 DIAGNOSIS — M25562 Pain in left knee: Secondary | ICD-10-CM

## 2023-02-15 MED ORDER — DICLOFENAC SODIUM 75 MG PO TBEC: 75.0000 mg | DELAYED_RELEASE_TABLET | Freq: Two times a day (BID) | ORAL | 0 refills | Status: AC

## 2023-02-15 MED ORDER — PREDNISONE 20 MG PO TABS: 40.0000 mg | ORAL_TABLET | Freq: Every day | ORAL | 0 refills | Status: AC

## 2023-02-15 NOTE — ED Triage Notes (Signed)
2 weeks ago, was playing basketball.  Both knees hurt for the next few days.  Right knee improved, left knee worsened.  Left knee hurts to bend, weight bearing to left knee is painful.  Pain located under knee, but has felt shooting pain posterior knee Left knee is swollen.   Patient has been using icy hot cream.  [facility] hot/cold patches.  Patient has taken ibuprofen.

## 2023-02-17 NOTE — ED Provider Notes (Signed)
[facility]   [id number] 02/15/23 Arrival Time: 1513  ASSESSMENT & PLAN:  1. Acute pain of left knee    I have personally viewed and independently interpreted the imaging studies ordered this visit. No acute bony changes on knee films today.  Trial of: Discharge Medication List as of 02/15/2023  4:52 PM     START taking these medications   Details  diclofenac (VOLTAREN) 75 MG EC tablet Take 1 tablet (75 mg total) by mouth 2 (two) times daily., Starting Sat 02/15/2023, Normal    predniSONE (DELTASONE) 20 MG tablet Take 2 tablets (40 mg total) by mouth daily., Starting Sat 02/15/2023, Normal        Orders Placed This Encounter  Procedures   DG Knee 2 Views Left    Work/school excuse note: provided. Recommend:  Follow-up Information     Schedule an appointment as soon as possible for a visit  with [facility].   Contact information: [address] [place] [address] [phone]                Reviewed expectations re: course of current medical issues. Questions answered. Outlined signs and symptoms indicating need for more acute intervention. Patient verbalized understanding. After Visit Summary given.  SUBJECTIVE: History from: patient. Joseph Garza is a 52 y.o. male who reports 2 weeks ago, was playing basketball.  Both knees hurt for the next few days.  Right knee improved, left knee worsened.  Left knee hurts to bend, weight bearing to left knee is painful.  Pain located under knee, but has felt shooting pain posterior knee Left knee is swollen.   Patient has been using icy hot cream.  [facility] hot/cold patches.  Patient has taken ibuprofen.   Mild relief.  History reviewed. No pertinent surgical history.    OBJECTIVE:  Vitals:   02/15/23 1559  BP: 114/80  Pulse: 85  Resp: 20  Temp: 98.6 F (37 C)  TempSrc: Oral  SpO2: 93%    General appearance: alert; no distress HEENT: [place];  AT Neck: supple with FROM Resp: unlabored respirations Extremities: LLE: warm with well perfused appearance; poorly localized moderate tenderness over left anterior lateral knee; without gross deformities; swelling: none; bruising: none; knee ROM: normal, with discomfort CV: brisk extremity capillary refill of LLE; 2+ DP pulse of LLE. Skin: warm and dry; no visible rashes Neurologic: gait normal; normal sensation and strength of LLE Psychological: alert and cooperative; normal mood and affect  Imaging: DG Knee 2 Views Left  Result Date: 02/15/2023 CLINICAL DATA:  Persistent knee pain after playing basketball 2 weeks ago. EXAM: LEFT KNEE - 1-2 VIEW COMPARISON:  None Available. FINDINGS: Density along the suprapatellar bursa suspicious for a small to moderate knee effusion, although ill-defined. No fracture or acute bony abnormality. IMPRESSION: 1. Suspected small to moderate knee effusion. No fracture or acute bony abnormality identified. If pain persists despite conservative therapy, MRI may be warranted for further characterization. Electronically Signed   By: Gaylyn Rong M.D.   On: 02/15/2023 16:58       Allergies  Allergen Reactions   Penicillins Hives and Other (See Comments)    Has patient had a PCN reaction causing immediate rash, facial/tongue/throat swelling, SOB or lightheadedness with hypotension: Yes Has patient had a PCN reaction causing severe rash involving mucus membranes or skin necrosis: Yes Has patient had a PCN reaction that required hospitalization Unknown Has patient had a PCN reaction occurring within the  last 10 years: No If all of the above answers are "NO", then may proceed with Cephalosporin use.     Past Medical History:  Diagnosis Date   MRSA (methicillin resistant Staphylococcus aureus)    Social History   Socioeconomic History   Marital status: Single    Spouse name: Not on file   Number of children: Not on file   Years of education: Not on  file   Highest education level: Not on file  Occupational History   Not on file  Tobacco Use   Smoking status: Every Day    Current packs/day: 0.50    Types: Cigarettes   Smokeless tobacco: Never  Vaping Use   Vaping status: Never Used  Substance and Sexual Activity   Alcohol use: Yes    Comment: six pack a week   Drug use: No   Sexual activity: Not on file  Other Topics Concern   Not on file  Social History Narrative   Not on file   Social Determinants of Health   Financial Resource Strain: Not on file  Food Insecurity: Not on file  Transportation Needs: Not on file  Physical Activity: Not on file  Stress: Not on file  Social Connections: Not on file   History reviewed. No pertinent family history. History reviewed. No pertinent surgical history.     Mardella Layman, MD 02/17/23 1021
